# Patient Record
Sex: Male | Born: 1975 | Race: White | Hispanic: No | Marital: Single | Smoking: Current every day smoker
Health system: Southern US, Community
[De-identification: ages and names within clinical notes are randomized; demographics above are authoritative.]

## PROBLEM LIST (undated history)

## (undated) DIAGNOSIS — F1990 Other psychoactive substance use, unspecified, uncomplicated: Secondary | ICD-10-CM

## (undated) HISTORY — PX: SPLENECTOMY, TOTAL: SHX788

---

## 1998-04-16 ENCOUNTER — Emergency Department (HOSPITAL_COMMUNITY): Admission: EM | Admit: 1998-04-16 | Discharge: 1998-04-17 | Payer: Self-pay | Admitting: Emergency Medicine

## 1999-11-05 ENCOUNTER — Emergency Department (HOSPITAL_COMMUNITY): Admission: EM | Admit: 1999-11-05 | Discharge: 1999-11-05 | Payer: Self-pay | Admitting: Emergency Medicine

## 2004-09-05 ENCOUNTER — Emergency Department (HOSPITAL_COMMUNITY): Admission: EM | Admit: 2004-09-05 | Discharge: 2004-09-05 | Payer: Self-pay | Admitting: Emergency Medicine

## 2010-11-21 ENCOUNTER — Emergency Department (HOSPITAL_COMMUNITY)
Admission: EM | Admit: 2010-11-21 | Discharge: 2010-11-22 | Disposition: A | Discharge status: Home / Self Care | Payer: Self-pay | Attending: Emergency Medicine | Admitting: Emergency Medicine

## 2010-11-21 DIAGNOSIS — F1910 Other psychoactive substance abuse, uncomplicated: Secondary | ICD-10-CM | POA: Insufficient documentation

## 2010-11-21 DIAGNOSIS — F1410 Cocaine abuse, uncomplicated: Secondary | ICD-10-CM | POA: Insufficient documentation

## 2010-11-21 LAB — DIFFERENTIAL
Basophils Absolute: 0.1 10*3/uL (ref 0.0–0.1)
Basophils Relative: 2 % — ABNORMAL HIGH (ref 0–1)
Eosinophils Absolute: 0.4 10*3/uL (ref 0.0–0.7)
Monocytes Absolute: 0.4 10*3/uL (ref 0.1–1.0)
Monocytes Relative: 5 % (ref 3–12)

## 2010-11-21 LAB — BASIC METABOLIC PANEL
BUN: 14 mg/dL (ref 6–23)
Calcium: 9.3 mg/dL (ref 8.4–10.5)
Creatinine, Ser: 1.04 mg/dL (ref 0.4–1.5)
GFR calc Af Amer: >60 mL/min (ref >60)
GFR calc non Af Amer: >60 mL/min (ref >60)

## 2010-11-21 LAB — ETHANOL: Alcohol, Ethyl (B): <11 mg/dL — ABNORMAL HIGH (ref 0–10)

## 2010-11-21 LAB — CBC
MCH: 28.4 pg (ref 26.0–34.0)
MCHC: 33.7 g/dL (ref 30.0–36.0)
Platelets: 159 10*3/uL (ref 150–400)
RDW: 12.7 % (ref 11.5–15.5)

## 2010-11-22 LAB — URINALYSIS, ROUTINE W REFLEX MICROSCOPIC
Glucose, UA: NEGATIVE mg/dL
Ketones, ur: NEGATIVE mg/dL
Nitrite: NEGATIVE
Protein, ur: NEGATIVE mg/dL
Urobilinogen, UA: 0.2 mg/dL (ref 0.0–1.0)

## 2010-11-22 LAB — RAPID URINE DRUG SCREEN, HOSP PERFORMED: Benzodiazepines: NOT DETECTED

## 2012-08-24 ENCOUNTER — Telehealth (HOSPITAL_COMMUNITY): Payer: Self-pay | Admitting: Orthopedic Surgery
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
## 2012-09-04 ENCOUNTER — Ambulatory Visit (INDEPENDENT_AMBULATORY_CARE_PROVIDER_SITE_OTHER): Payer: Self-pay | Admitting: Orthopedic Surgery

## 2012-09-04 ENCOUNTER — Encounter (INDEPENDENT_AMBULATORY_CARE_PROVIDER_SITE_OTHER): Payer: Self-pay

## 2012-09-04 DIAGNOSIS — S35229A Unspecified injury of superior mesenteric artery, initial encounter: Secondary | ICD-10-CM

## 2012-09-04 DIAGNOSIS — S91009A Unspecified open wound, unspecified ankle, initial encounter: Secondary | ICD-10-CM

## 2012-09-04 DIAGNOSIS — S81009A Unspecified open wound, unspecified knee, initial encounter: Secondary | ICD-10-CM

## 2012-09-04 DIAGNOSIS — S81019A Laceration without foreign body, unspecified knee, initial encounter: Secondary | ICD-10-CM

## 2012-09-04 MED ORDER — IBUPROFEN 800 MG PO TABS: 800.0000 mg | ORAL_TABLET | Freq: Three times a day (TID) | ORAL | Status: DC

## 2012-09-04 MED ORDER — TRAMADOL HCL 50 MG PO TABS: 100.0000 mg | ORAL_TABLET | Freq: Four times a day (QID) | ORAL | Status: AC

## 2012-09-04 MED ORDER — HYDROCODONE-ACETAMINOPHEN 5-325 MG PO TABS: 1.0000 | ORAL_TABLET | ORAL | Status: DC | PRN

## 2012-09-04 NOTE — Patient Instructions (Signed)
No lifting more than 5 pounds until 6 weeks after your surgery date. After that, no restrictions.  No driving while taking hydrocodone.  You may return to work with the above restrictions.
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
## 2014-05-15 ENCOUNTER — Emergency Department (HOSPITAL_BASED_OUTPATIENT_CLINIC_OR_DEPARTMENT_OTHER)
Admission: EM | Admit: 2014-05-15 | Discharge: 2014-05-15 | Disposition: A | Discharge status: Home / Self Care | Payer: Self-pay | Attending: Emergency Medicine | Admitting: Emergency Medicine

## 2014-05-15 ENCOUNTER — Encounter (HOSPITAL_BASED_OUTPATIENT_CLINIC_OR_DEPARTMENT_OTHER): Payer: Self-pay | Admitting: Emergency Medicine

## 2014-05-15 DIAGNOSIS — L02512 Cutaneous abscess of left hand: Secondary | ICD-10-CM | POA: Insufficient documentation

## 2014-05-15 DIAGNOSIS — L03114 Cellulitis of left upper limb: Secondary | ICD-10-CM | POA: Insufficient documentation

## 2014-05-15 DIAGNOSIS — L0291 Cutaneous abscess, unspecified: Secondary | ICD-10-CM

## 2014-05-15 DIAGNOSIS — Z79891 Long term (current) use of opiate analgesic: Secondary | ICD-10-CM | POA: Insufficient documentation

## 2014-05-15 DIAGNOSIS — L0390 Cellulitis, unspecified: Secondary | ICD-10-CM

## 2014-05-15 DIAGNOSIS — Z720 Tobacco use: Secondary | ICD-10-CM | POA: Insufficient documentation

## 2014-05-15 MED ORDER — VANCOMYCIN HCL IN DEXTROSE 1-5 GM/200ML-% IV SOLN
1000.0000 mg | Freq: Once | INTRAVENOUS | Status: AC
  Administered 2014-05-15: 1000 mg via INTRAVENOUS
  Filled 2014-05-15: qty 200

## 2014-05-15 MED ORDER — SULFAMETHOXAZOLE-TRIMETHOPRIM 800-160 MG PO TABS: 1.0000 | ORAL_TABLET | Freq: Two times a day (BID) | ORAL | Status: AC

## 2014-05-15 MED ORDER — CEPHALEXIN 500 MG PO CAPS: 500.0000 mg | ORAL_CAPSULE | Freq: Four times a day (QID) | ORAL | Status: DC

## 2014-05-15 MED ORDER — LIDOCAINE HCL 2 % IJ SOLN
INTRAMUSCULAR | Status: AC
  Filled 2014-05-15: qty 20

## 2014-05-15 NOTE — ED Notes (Signed)
Patient here with left hand abscess, reports injected cocaine on Wednesday and Thursday am developed pain, redness and drainage from hand

## 2014-05-15 NOTE — ED Notes (Signed)
I & D tray is at the bedside set up and ready for the doctor to use. 

## 2014-05-15 NOTE — ED Notes (Signed)
MD at bedside. 

## 2014-05-15 NOTE — Discharge Instructions (Signed)

## 2014-05-15 NOTE — ED Provider Notes (Signed)
CSN: [id number]     Arrival date & time 05/15/14  1315 History   First MD Initiated Contact with Patient 05/15/14 1342     Chief Complaint  Patient presents with  . Abscess     (Consider location/radiation/quality/duration/timing/severity/associated sxs/prior Treatment) HPI Comments: Patient presents with redness and pain to his left hand. He is a substance abuser and was injecting heroin and cocaine into his hand about 3 days ago. He woke up this morning with redness and pain with drainage from the area. He states he was having a little bit of swelling and discomfort the last couple days. He only noticed the redness starting this morning. 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 He denies any fevers vomiting or other symptoms. His tetanus shot is up-to-date.  Patient is a 38 y.o. male presenting with abscess.  Abscess Associated symptoms: no fever, no headaches, no nausea and no vomiting     History reviewed. No pertinent past medical history. History reviewed. No pertinent past surgical history. No family history on file. History  Substance Use Topics  . Smoking status: Current Every Day Smoker    Types: Cigarettes  . Smokeless tobacco: Never Used  . Alcohol Use: Not on file    Review of Systems  Constitutional: Negative for fever.  Gastrointestinal: Negative for nausea and vomiting.  Musculoskeletal: Negative for arthralgias, back pain, joint swelling and neck pain.  Skin: Positive for color change and wound.  Neurological: Negative for weakness, numbness and headaches.      Allergies  Review of patient's allergies indicates no known allergies.  Home Medications   Prior to Admission medications   Medication Sig Start Date End Date Taking? Authorizing Provider  methadone (DOLOPHINE) 10 MG tablet Take 10 mg by mouth daily.   Yes Historical Provider, MD  cephALEXin (KEFLEX) 500 MG capsule Take 1 capsule (500 mg total) by mouth 4 (four) times daily.  05/15/14   Rolan BuccoMelanie Anel Purohit, MD  sulfamethoxazole-trimethoprim (BACTRIM DS,SEPTRA DS) 800-160 MG per tablet Take 1 tablet by mouth 2 (two) times daily. 05/15/14 05/22/14  Rolan BuccoMelanie Scharlene Catalina, MD   BP 135/52  Pulse 79  Temp(Src) 98.7 F (37.1 C) (Oral)  Resp 18  SpO2 94% Physical Exam  Constitutional: He is oriented to person, place, and time. He appears well-developed and well-nourished.  HENT:  Head: Normocephalic and atraumatic.  Eyes: Pupils are equal, round, and reactive to light.  Neck: Normal range of motion. Neck supple.  Cardiovascular: Normal rate.   Pulmonary/Chest: Effort normal.  Abdominal: Soft. There is no tenderness.  Musculoskeletal: He exhibits edema and tenderness.  Patient has swelling and redness over the dorsum of the left hand with a pointed abscess in the center of the redness. It's fluctuant in this area. There is no redness extending past the wrist. There is no pain with range of motion of the fingers. There is no pain to the volar surface of the hand.  Neurological: He is alert and oriented to person, place, and time.  Skin: Skin is warm and dry.  Psychiatric: He has a normal mood and affect.    ED Course  INCISION AND DRAINAGE Date/Time: 05/15/2014 3:11 PM Performed by: Neola Worrall Authorized by: Rolan BuccoBELFI, Konner Warrior Consent: Verbal consent obtained. Risks and benefits: risks, benefits and alternatives were discussed Consent given by: patient Patient identity confirmed: verbally with patient Type: abscess Body area: upper extremity Location details: left hand Anesthesia: local infiltration Local anesthetic: lidocaine 1% with epinephrine Anesthetic total: 2 ml Patient sedated: no  Scalpel size: 11 Incision type: elliptical Drainage: purulent Drainage amount: scant Wound treatment: wound left open Patient tolerance: Patient tolerated the procedure well with no immediate complications. Comments: I had already expressed a moderate amount of pus from the wound  prior to I&D   (including critical care time) Labs Review Labs Reviewed - No data to display  Imaging Review No results found.   EKG Interpretation None      MDM   Final diagnoses:  Abscess and cellulitis    Patient was given a dose of vancomycin. His tetanus shot is up-to-date. He has an abscess and surrounding cellulitis. At this point I don't suspect that deeper infection given that he has no pain with range of motion of the fingers or wrist. I do feel like he needs close follow-up and I advised him to come back tomorrow for recheck. If it's not looking significantly better or certainly if it's looking worse, a hand surgeon can be consulted.  Pt was started on keflex and bactim.      Rolan BuccoMelanie Krissi Willaims, MD 05/15/14 [phone]

## 2014-05-16 ENCOUNTER — Emergency Department (HOSPITAL_BASED_OUTPATIENT_CLINIC_OR_DEPARTMENT_OTHER): Admission: EM | Admit: 2014-05-16 | Discharge: 2014-05-16 | Discharge status: Against Medical Advice | Payer: Self-pay

## 2014-12-14 ENCOUNTER — Emergency Department (HOSPITAL_BASED_OUTPATIENT_CLINIC_OR_DEPARTMENT_OTHER)
Admission: EM | Admit: 2014-12-14 | Discharge: 2014-12-14 | Disposition: A | Discharge status: Home / Self Care | Payer: Self-pay | Attending: Emergency Medicine | Admitting: Emergency Medicine

## 2014-12-14 ENCOUNTER — Emergency Department (HOSPITAL_BASED_OUTPATIENT_CLINIC_OR_DEPARTMENT_OTHER): Payer: Self-pay

## 2014-12-14 ENCOUNTER — Encounter (HOSPITAL_BASED_OUTPATIENT_CLINIC_OR_DEPARTMENT_OTHER): Payer: Self-pay

## 2014-12-14 DIAGNOSIS — Z79899 Other long term (current) drug therapy: Secondary | ICD-10-CM | POA: Insufficient documentation

## 2014-12-14 DIAGNOSIS — L02414 Cutaneous abscess of left upper limb: Secondary | ICD-10-CM | POA: Insufficient documentation

## 2014-12-14 DIAGNOSIS — Z720 Tobacco use: Secondary | ICD-10-CM | POA: Insufficient documentation

## 2014-12-14 HISTORY — DX: Other psychoactive substance use, unspecified, uncomplicated: F19.90

## 2014-12-14 LAB — COMPREHENSIVE METABOLIC PANEL
ALBUMIN: 3.7 g/dL (ref 3.5–5.0)
ALT: 22 U/L (ref 17–63)
ANION GAP: 12 (ref 5–15)
AST: 27 U/L (ref 15–41)
Alkaline Phosphatase: 110 U/L (ref 38–126)
BUN: 15 mg/dL (ref 6–20)
CALCIUM: 8.4 mg/dL — AB (ref 8.9–10.3)
CHLORIDE: 102 mmol/L (ref 101–111)
CO2: 24 mmol/L (ref 22–32)
Creatinine, Ser: 1.15 mg/dL (ref 0.61–1.24)
GFR calc Af Amer: >60 mL/min (ref >60)
GFR calc non Af Amer: >60 mL/min (ref >60)
Glucose, Bld: 117 mg/dL — ABNORMAL HIGH (ref 65–99)
POTASSIUM: 4.1 mmol/L (ref 3.5–5.1)
SODIUM: 138 mmol/L (ref 135–145)
Total Bilirubin: 0.7 mg/dL (ref 0.3–1.2)
Total Protein: 7.5 g/dL (ref 6.5–8.1)

## 2014-12-14 LAB — CBC
HEMATOCRIT: 41.3 % (ref 39.0–52.0)
HEMOGLOBIN: 13.5 g/dL (ref 13.0–17.0)
MCH: 28.3 pg (ref 26.0–34.0)
MCHC: 32.7 g/dL (ref 30.0–36.0)
MCV: 86.6 fL (ref 78.0–100.0)
Platelets: 164 10*3/uL (ref 150–400)
RBC: 4.77 MIL/uL (ref 4.22–5.81)
RDW: 13.5 % (ref 11.5–15.5)
WBC: 11.4 10*3/uL — AB (ref 4.0–10.5)

## 2014-12-14 MED ORDER — HYDROMORPHONE HCL 1 MG/ML IJ SOLN
1.0000 mg | Freq: Once | INTRAMUSCULAR | Status: AC
  Administered 2014-12-14: 1 mg via INTRAVENOUS
  Filled 2014-12-14: qty 1

## 2014-12-14 MED ORDER — SULFAMETHOXAZOLE-TRIMETHOPRIM 800-160 MG PO TABS
1.0000 | ORAL_TABLET | Freq: Once | ORAL | Status: AC
  Administered 2014-12-14: 1 via ORAL
  Filled 2014-12-14: qty 1

## 2014-12-14 MED ORDER — CLINDAMYCIN HCL 300 MG PO CAPS: 300.0000 mg | ORAL_CAPSULE | Freq: Four times a day (QID) | ORAL | Status: DC

## 2014-12-14 MED ORDER — ACETAMINOPHEN 325 MG PO TABS
650.0000 mg | ORAL_TABLET | Freq: Once | ORAL | Status: AC
  Administered 2014-12-14: 650 mg via ORAL
  Filled 2014-12-14: qty 2

## 2014-12-14 MED ORDER — CLINDAMYCIN PHOSPHATE 600 MG/50ML IV SOLN
600.0000 mg | Freq: Once | INTRAVENOUS | Status: AC
  Administered 2014-12-14: 600 mg via INTRAVENOUS
  Filled 2014-12-14: qty 50

## 2014-12-14 MED ORDER — HYDROMORPHONE HCL 1 MG/ML IJ SOLN
2.0000 mg | Freq: Once | INTRAMUSCULAR | Status: AC
  Administered 2014-12-14: 2 mg via INTRAVENOUS
  Filled 2014-12-14: qty 2

## 2014-12-14 MED ORDER — OXYCODONE-ACETAMINOPHEN 5-325 MG PO TABS: 1.0000 | ORAL_TABLET | Freq: Three times a day (TID) | ORAL | Status: DC | PRN

## 2014-12-14 MED ORDER — SULFAMETHOXAZOLE-TRIMETHOPRIM 800-160 MG PO TABS: 1.0000 | ORAL_TABLET | Freq: Two times a day (BID) | ORAL | Status: AC

## 2014-12-14 MED ORDER — LIDOCAINE-EPINEPHRINE 2 %-1:100000 IJ SOLN
1.7000 mL | Freq: Once | INTRAMUSCULAR | Status: AC
  Administered 2014-12-14: 1.7 mL via INTRADERMAL
  Filled 2014-12-14: qty 1

## 2014-12-14 NOTE — ED Notes (Signed)
I/d cart at bedside per md request

## 2014-12-14 NOTE — Discharge Instructions (Signed)

## 2014-12-14 NOTE — ED Notes (Signed)
md is at bedside for I/D

## 2014-12-14 NOTE — ED Provider Notes (Signed)
Addendum: ABX given were clindamycin for anaerobic coverage and bactrim for gram negative coverage  Christopher Rhineonald Lucah Petta, MD 12/14/14 2338

## 2014-12-14 NOTE — ED Notes (Signed)
IV drug use 2 days ago-abscess to left forearm x 5 days

## 2014-12-14 NOTE — ED Provider Notes (Signed)
CSN: [id number]     Arrival date & time 12/14/14  1825 History  This chart was scribed for Zadie Rhine, MD by Richarda Overlie, ED Scribe. This patient was seen in room MH11/MH11 and the patient's care was started 7:09 PM.     Chief Complaint  Patient presents with  . Arm Swelling  Patient gave verbal permission to utilize photo for medical documentation only The image was not stored on any personal device  Patient is a 39 y.o. male presenting with drug problem. The history is provided by the patient. No language interpreter was used.  Drug Problem This is a recurrent problem. The current episode started more than 1 week ago. The problem has been gradually worsening. Pertinent negatives include no chest pain and no shortness of breath. The symptoms are aggravated by bending. Nothing relieves the symptoms.   HPI Comments: Christopher Shields is a 39 y.o. male who presents to the Emergency Department complaining of a left forearm abscess that he noticed 2 weeks ago. He reports some associated pain, weakness and numbness in his left forearm area. 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 He reports that he has had a fever of approximately 100 currently. Pt reports NKDA. He denies any hx of heart murmurs. 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 He denies vomiting, CP or SOB.   Past Medical History  Diagnosis Date  . Drug use    Past Surgical History  Procedure Laterality Date  . Splenectomy, total     No family history on file. History  Substance Use Topics  . Smoking status: Current Every Day Smoker    Types: Cigarettes  . Smokeless tobacco: Never Used  . Alcohol Use: Yes    Review of Systems  Constitutional: Positive for fever.  Respiratory: Negative for shortness of breath.   Cardiovascular: Negative for chest pain.  Skin: Positive for color change.       Abscess  All other systems reviewed and are negative.  Allergies  Review of patient's allergies  indicates no known allergies.  Home Medications   Prior to Admission medications   Medication Sig Start Date End Date Taking? Authorizing Provider  methadone (DOLOPHINE) 10 MG tablet Take 10 mg by mouth daily.    Historical Provider, MD   BP 134/78 mmHg  Pulse 103  Temp(Src) 100.2 F (37.9 C) (Oral)  Resp 18  Ht  (1.88 m)  Wt 230 lb (104.327 kg)  BMI 29.52 kg/m2  SpO2 94% Physical Exam  CONSTITUTIONAL: Well developed/well nourished HEAD: Normocephalic/atraumatic EYES: EOMI/PERRL ENMT: Mucous membranes moist NECK: supple no meningeal signs SPINE/BACK:entire spine nontender CV: S1/S2 noted, no murmurs/rubs/gallops noted LUNGS: Lungs are clear to auscultation bilaterally, no apparent distress ABDOMEN: soft, nontender, no rebound or guarding, bowel sounds noted throughout abdomen GU:no cva tenderness NEURO: Pt is awake/alert/appropriate, moves all extremitiesx4.  No facial droop.   EXTREMITIES: pulses normal/equal, full ROM. No crepitus, no streaking. See photo.  SKIN: warm, color normal PSYCH: no abnormalities of mood noted, alert and oriented to situation     ED Course  Procedures    INCISION AND DRAINAGE Performed by: Joya Gaskins Consent: Verbal consent obtained. Risks and benefits: risks, benefits and alternatives were discussed Type: abscess  Body area: left forearm  Anesthesia: local infiltration  Incision was made with a scalpel.  Local anesthetic: lidocaine 1% with epinephrine  Anesthetic total: 10 ml  Complexity: complex Blunt dissection to break up loculations  Drainage:  purulent  Drainage amount: significant drainage noted Wound was flushed extensively with normal saline  Patient tolerance: Patient tolerated the procedure well with no immediate complications. wound explored, no foreign bodies noted, no bone/tendon exposed, pt has full ROM of all fingers on left hand and edema is significantly improved and no crepitus  noted   DIAGNOSTIC STUDIES: Oxygen Saturation is 94% on RA, normal by my interpretation.    COORDINATION OF CARE: 7:16 PM Discussed treatment plan with pt at bedside and pt agreed to plan.  EMERGENCY DEPARTMENT US SOFT TISSUE INTERPRETATION "Study: Limited Ultrasound of the noted body part in comments below"  INDICATIONS: Soft tissue infection Multiple views of the body part are obtained with a multi-frequency linear probe  PERFORMED BY:  Myself  IMAGES ARCHIVED?: Yes  SIDE:Left  BODY PART:Upper extremity  FINDINGS: Abcess present  LIMITATIONS:  Emergent Procedure  INTERPRETATION:  Abcess present    10:16 PM Photo after I&D    Pt felt improved after I&D  No erythematous streaking He can range both elbow and wrist without difficulty He is not toxic appearing He had no crepitus I don't feel this represents necrotizing fascitis He will start oral clindamycin/bactrim at home and return in one day for wound check We discussed strict return precautions including worsened pain/redness to his arm Bandage applied to wound  Labs Review Labs Reviewed  CBC - Abnormal; Notable for the following:    WBC 11.4 (*)    All other components within normal limits  COMPREHENSIVE METABOLIC PANEL - Abnormal; Notable for the following:    Glucose, Bld 117 (*)    Calcium 8.4 (*)    All other components within normal limits  CULTURE, BLOOD (ROUTINE X 2)  CULTURE, BLOOD (ROUTINE X 2)    Imaging Review Dg Forearm Left  12/14/2014   CLINICAL DATA:  Forearm abscess  EXAM: LEFT FOREARM - 2 VIEW  COMPARISON:  None.  FINDINGS: Frontal and lateral views were obtained. There is extensive soft tissue edema throughout the proximal to mid forearm region, particularly along the volar aspect. No air or calcification is noted in this area of extensive soft tissue prominence. No fracture or dislocation. No erosive change or bony destruction. No appreciable arthropathy. There is a minus ulnar  variant.  IMPRESSION: No bony abnormality. Extensive soft tissue edema is noted, most severe more proximally in along the volar aspect. There may well be abscess in this area, although there is no air-fluid level. The appearance does suggest inflammatory etiology.   Electronically Signed   By: Bretta BangWilliam  Woodruff III M.D.   On: 12/14/2014 20:33      MDM   Final diagnoses:  Abscess of left forearm    Nursing notes including past medical history and social history reviewed and considered in documentation Labs/vital reviewed myself and considered during evaluation xrays/imaging reviewed by myself and considered during evaluation    I personally performed the services described in this documentation, which was scribed in my presence. The recorded information has been reviewed and is accurate.     Zadie Rhineonald Tekeisha Hakim, MD 12/14/14 [phone]

## 2014-12-14 NOTE — ED Notes (Signed)
Patient transported to X-ray 

## 2014-12-16 ENCOUNTER — Emergency Department (HOSPITAL_BASED_OUTPATIENT_CLINIC_OR_DEPARTMENT_OTHER)
Admission: EM | Admit: 2014-12-16 | Discharge: 2014-12-16 | Disposition: A | Discharge status: Home / Self Care | Payer: Self-pay | Attending: Emergency Medicine | Admitting: Emergency Medicine

## 2014-12-16 ENCOUNTER — Encounter (HOSPITAL_BASED_OUTPATIENT_CLINIC_OR_DEPARTMENT_OTHER): Payer: Self-pay | Admitting: *Deleted

## 2014-12-16 DIAGNOSIS — Z79899 Other long term (current) drug therapy: Secondary | ICD-10-CM | POA: Insufficient documentation

## 2014-12-16 DIAGNOSIS — Z4801 Encounter for change or removal of surgical wound dressing: Secondary | ICD-10-CM | POA: Insufficient documentation

## 2014-12-16 DIAGNOSIS — Z720 Tobacco use: Secondary | ICD-10-CM | POA: Insufficient documentation

## 2014-12-16 DIAGNOSIS — Z5189 Encounter for other specified aftercare: Secondary | ICD-10-CM

## 2014-12-16 DIAGNOSIS — Z792 Long term (current) use of antibiotics: Secondary | ICD-10-CM | POA: Insufficient documentation

## 2014-12-16 NOTE — ED Provider Notes (Signed)
CSN: [id number]     Arrival date & time 12/16/14  0840 History   First MD Initiated Contact with Patient 12/16/14 [phone]     Chief Complaint  Patient presents with  . Wound Check     (Consider location/radiation/quality/duration/timing/severity/associated sxs/prior Treatment) Patient is a 39 y.o. male presenting with wound check. The history is provided by the patient.  Wound Check This is a new problem. The current episode started more than 2 days ago. The problem occurs constantly. The problem has been gradually improving. Pertinent negatives include no chest pain, no abdominal pain, no headaches and no shortness of breath. Nothing aggravates the symptoms. Relieved by: abx, I/D. Treatments tried: I/D, abx. The treatment provided significant relief.    Past Medical History  Diagnosis Date  . Drug use    Past Surgical History  Procedure Laterality Date  . Splenectomy, total     History reviewed. No pertinent family history. History  Substance Use Topics  . Smoking status: Current Every Day Smoker    Types: Cigarettes  . Smokeless tobacco: Never Used  . Alcohol Use: Yes    Review of Systems  Constitutional: Negative for fever.  HENT: Negative for drooling and rhinorrhea.   Eyes: Negative for pain.  Respiratory: Negative for cough and shortness of breath.   Cardiovascular: Negative for chest pain and leg swelling.  Gastrointestinal: Negative for nausea, vomiting, abdominal pain and diarrhea.  Genitourinary: Negative for dysuria and hematuria.  Musculoskeletal: Negative for gait problem and neck pain.  Skin: Negative for color change.  Neurological: Negative for numbness and headaches.  Hematological: Negative for adenopathy.  Psychiatric/Behavioral: Negative for behavioral problems.  All other systems reviewed and are negative.     Allergies  Review of patient's allergies indicates no known allergies.  Home Medications   Prior to Admission medications   Medication  Sig Start Date End Date Taking? Authorizing Provider  clindamycin (CLEOCIN) 300 MG capsule Take 1 capsule (300 mg total) by mouth 4 (four) times daily. X 7 days 12/14/14   Zadie Rhine, MD  methadone (DOLOPHINE) 10 MG tablet Take 95 mg by mouth daily.     Historical Provider, MD  oxyCODONE-acetaminophen (PERCOCET/ROXICET) 5-325 MG per tablet Take 1 tablet by mouth every 8 (eight) hours as needed for severe pain. 12/14/14   Zadie Rhine, MD  sulfamethoxazole-trimethoprim (BACTRIM DS,SEPTRA DS) 800-160 MG per tablet Take 1 tablet by mouth 2 (two) times daily. 12/14/14 12/21/14  Zadie Rhine, MD   BP 126/92 mmHg  Pulse 65  Temp(Src) 98.5 F (36.9 C) (Oral)  Resp 18  Ht  (1.88 m)  Wt 240 lb (108.863 kg)  BMI 30.80 kg/m2  SpO2 95% Physical Exam  Constitutional: He is oriented to person, place, and time. He appears well-developed and well-nourished.  HENT:  Head: Normocephalic and atraumatic.  Right Ear: External ear normal.  Left Ear: External ear normal.  Nose: Nose normal.  Mouth/Throat: Oropharynx is clear and moist. No oropharyngeal exudate.  Eyes: Conjunctivae and EOM are normal. Pupils are equal, round, and reactive to light.  Neck: Normal range of motion. Neck supple.  Cardiovascular: Normal rate, regular rhythm, normal heart sounds and intact distal pulses.  Exam reveals no gallop and no friction rub.   No murmur heard. Pulmonary/Chest: Effort normal and breath sounds normal. No respiratory distress. He has no wheezes.  Abdominal: Soft. Bowel sounds are normal. He exhibits no distension. There is no tenderness. There is no rebound and no guarding.  Musculoskeletal: Normal range of  motion. He exhibits no edema or tenderness.  Postsurgical wound on mid volar aspect of left forearm. Resolving mild erythema surrounding the wound. Scant brownish drainage noted.  Normal strength and sensation in the left UE.  2+ distal pulses in the left upper extremity.    Neurological: He  is alert and oriented to person, place, and time.  Skin: Skin is warm and dry.  Psychiatric: He has a normal mood and affect. His behavior is normal.  Nursing note and vitals reviewed.   ED Course  Procedures (including critical care time) Labs Review Labs Reviewed - No data to display  Imaging Review Dg Forearm Left  12/14/2014   CLINICAL DATA:  Forearm abscess  EXAM: LEFT FOREARM - 2 VIEW  COMPARISON:  None.  FINDINGS: Frontal and lateral views were obtained. There is extensive soft tissue edema throughout the proximal to mid forearm region, particularly along the volar aspect. No air or calcification is noted in this area of extensive soft tissue prominence. No fracture or dislocation. No erosive change or bony destruction. No appreciable arthropathy. There is a minus ulnar variant.  IMPRESSION: No bony abnormality. Extensive soft tissue edema is noted, most severe more proximally in along the volar aspect. There may well be abscess in this area, although there is no air-fluid level. The appearance does suggest inflammatory etiology.   Electronically Signed   By: Bretta BangWilliam  Woodruff III M.D.   On: 12/14/2014 20:33     EKG Interpretation None        MDM   Final diagnoses:  Visit for wound check    9:13 AM 39 y.o. male who presents for wound evaluation status post incision and drainage 2 days ago. The patient notes he is feeling much better. He denies any fevers. He notes that the redness is resolving. There continues to be some mild drainage but otherwise he feels like he has improved significantly. I took a picture of the wound to compare to the previous visit. It does appear that the erythema is resolving. Will recommend he continue to take his anabiotics and return for any worsening.    Purvis SheffieldForrest Lyzette Reinhardt, MD 12/16/14 [phone]

## 2014-12-16 NOTE — ED Notes (Signed)
Return today for wound check last seen 2 days ago

## 2014-12-21 LAB — CULTURE, BLOOD (ROUTINE X 2)
CULTURE: NO GROWTH
CULTURE: NO GROWTH

## 2019-04-18 ENCOUNTER — Emergency Department (HOSPITAL_BASED_OUTPATIENT_CLINIC_OR_DEPARTMENT_OTHER)
Admission: EM | Admit: 2019-04-18 | Discharge: 2019-04-18 | Disposition: A | Discharge status: Home / Self Care | Payer: Self-pay | Attending: Emergency Medicine | Admitting: Emergency Medicine

## 2019-04-18 ENCOUNTER — Encounter (HOSPITAL_BASED_OUTPATIENT_CLINIC_OR_DEPARTMENT_OTHER): Payer: Self-pay

## 2019-04-18 ENCOUNTER — Other Ambulatory Visit: Payer: Self-pay

## 2019-04-18 DIAGNOSIS — F1910 Other psychoactive substance abuse, uncomplicated: Secondary | ICD-10-CM | POA: Insufficient documentation

## 2019-04-18 DIAGNOSIS — F17210 Nicotine dependence, cigarettes, uncomplicated: Secondary | ICD-10-CM | POA: Insufficient documentation

## 2019-04-18 DIAGNOSIS — H1033 Unspecified acute conjunctivitis, bilateral: Secondary | ICD-10-CM | POA: Insufficient documentation

## 2019-04-18 DIAGNOSIS — Z79899 Other long term (current) drug therapy: Secondary | ICD-10-CM | POA: Insufficient documentation

## 2019-04-18 MED ORDER — NEOMYCIN-POLYMYXIN-DEXAMETH 3.5-10000-0.1 OP SUSP: 2.0000 [drp] | Freq: Four times a day (QID) | OPHTHALMIC | 0 refills | Status: AC

## 2019-04-18 MED ORDER — NEOMYCIN-POLYMYXIN-DEXAMETH 3.5-10000-0.1 OP OINT
TOPICAL_OINTMENT | Freq: Once | OPHTHALMIC | Status: AC
  Administered 2019-04-18: 1 via OPHTHALMIC
  Filled 2019-04-18: qty 3.5

## 2019-04-18 NOTE — ED Triage Notes (Signed)
Pt has redness to bilateral eyes R>L. Pt reports eye drainage and a pressure behind the eyes.

## 2019-04-18 NOTE — ED Provider Notes (Signed)
[place] EMERGENCY DEPARTMENT Provider Note   CSN: [id number] Arrival date & time: 04/18/19  0037     History   Chief Complaint Chief Complaint  Patient presents with  . Eye Problem    HPI Christopher Shields is a 43 y.o. male.     The history is provided by the patient and a friend.  Eye Problem Location:  Both eyes Quality:  Tearing Severity:  Mild Onset quality:  Gradual Duration:  2 months Timing:  Constant Progression:  Worsening Context: contact lenses (that had been for at least 2 months)   Context: not burn   Relieved by:  Nothing Worsened by:  Nothing Ineffective treatments:  None tried Associated symptoms: discharge, inflammation and redness   Associated symptoms: no blurred vision, no decreased vision, no headaches, no itching, no photophobia, no scotomas and no swelling   Risk factors: no exposure to pinkeye, no previous injury to eye and no recent herpes zoster     Past Medical History:  Diagnosis Date  . Drug use     There are no active problems to display for this patient.   Past Surgical History:  Procedure Laterality Date  . SPLENECTOMY, TOTAL          Home Medications    Prior to Admission medications   Medication Sig Start Date End Date Taking? Authorizing Provider  methadone (DOLOPHINE) 10 MG tablet Take 95 mg by mouth daily.     [provider]  neomycin-polymyxin b-dexamethasone (MAXITROL) 3.5-10000-0.1 SUSP Place 2 drops into both eyes every 6 (six) hours for 7 days. 04/18/19 04/25/19  Colbert Curenton, Corene Cornea, MD    Family History No family history on file.  Social History Social History   Tobacco Use  . Smoking status: Current Every Day Smoker    Types: Cigarettes  . Smokeless tobacco: Never Used  Substance Use Topics  . Alcohol use: Yes  . Drug use: Yes    Types: IV    Comment: opiates      Allergies   Patient has no known allergies.   Review of Systems Review of Systems  Eyes: Positive for discharge  and redness. Negative for blurred vision, photophobia and itching.  Neurological: Negative for headaches.  All other systems reviewed and are negative.    Physical Exam Updated Vital Signs BP (!) 133/98 (BP Location: Left Arm)   Pulse 95   Temp 98.9 F (37.2 C) (Oral)   Resp 20   Ht 6\' 2"  (1.88 m)   Wt 88.5 kg   SpO2 100%   BMI 25.04 kg/m   Physical Exam Vitals signs and nursing note reviewed.  Constitutional:      Appearance: He is well-developed.  HENT:     Head: Normocephalic and atraumatic.  Eyes:     General: Lids are normal. No allergic shiner, visual field deficit or scleral icterus.       Right eye: Discharge (clear yellow) present. No foreign body.        Left eye: No foreign body or discharge.     Extraocular Movements: Extraocular movements intact.     Right eye: No nystagmus.     Left eye: No nystagmus.     Conjunctiva/sclera:     Right eye: Right conjunctiva is injected. No exudate or hemorrhage.    Left eye: Left conjunctiva is injected. No exudate or hemorrhage. Neck:     Musculoskeletal: Normal range of motion.  Cardiovascular:     Rate and Rhythm: Normal rate.  Pulmonary:     Effort: Pulmonary effort is normal. No respiratory distress.  Abdominal:     General: There is no distension.  Musculoskeletal: Normal range of motion.  Neurological:     Mental Status: He is alert.      ED Treatments / Results  Labs (all labs ordered are listed, but only abnormal results are displayed) Labs Reviewed - No data to display  EKG None  Radiology No results found.  Procedures Procedures (including critical care time)  Medications Ordered in ED Medications  neomycin-polymyxin b-dexamethasone (MAXITROL) ophthalmic ointment (1 application Both Eyes Given 04/18/19 0222)     Initial Impression / Assessment and Plan / ED Course  I have reviewed the triage vital signs and the nursing notes.  Pertinent labs & imaging results that were available during  my care of the patient were reviewed by me and considered in my medical decision making (see chart for details).   Likely conjunctival irritation 2/2 prolonged contact lens wearing but will cover for infection. No pain, vision loss or worsening blurry vision to suggest systemic cause, glaucoma or other emergent causes. Will follow up with optho if not improving in 3-5 days or starts to worsen.   Final Clinical Impressions(s) / ED Diagnoses   Final diagnoses:  Acute conjunctivitis of both eyes, unspecified acute conjunctivitis type    ED Discharge Orders         Ordered    neomycin-polymyxin b-dexamethasone (MAXITROL) 3.5-10000-0.1 SUSP  Every 6 hours     04/18/19 0208           Dartanion Teo, Barbara Cower, MD 04/18/19 [phone]

## 2019-04-18 NOTE — Discharge Instructions (Signed)
Use the ointment we gave you three times a day for 10 days. If you run out, use the prescription and follow directions.

## 2019-04-23 ENCOUNTER — Other Ambulatory Visit: Payer: Self-pay

## 2019-04-23 ENCOUNTER — Emergency Department (HOSPITAL_BASED_OUTPATIENT_CLINIC_OR_DEPARTMENT_OTHER): Payer: Self-pay

## 2019-04-23 ENCOUNTER — Inpatient Hospital Stay (HOSPITAL_BASED_OUTPATIENT_CLINIC_OR_DEPARTMENT_OTHER)
Admission: EM | Admit: 2019-04-23 | Discharge: 2019-04-26 | DRG: 871 | Discharge status: Against Medical Advice | Payer: Self-pay | Attending: Internal Medicine | Admitting: Internal Medicine

## 2019-04-23 ENCOUNTER — Encounter (HOSPITAL_BASED_OUTPATIENT_CLINIC_OR_DEPARTMENT_OTHER): Payer: Self-pay | Admitting: Emergency Medicine

## 2019-04-23 DIAGNOSIS — E876 Hypokalemia: Secondary | ICD-10-CM | POA: Diagnosis present

## 2019-04-23 DIAGNOSIS — D696 Thrombocytopenia, unspecified: Secondary | ICD-10-CM | POA: Diagnosis present

## 2019-04-23 DIAGNOSIS — F1910 Other psychoactive substance abuse, uncomplicated: Secondary | ICD-10-CM

## 2019-04-23 DIAGNOSIS — M4650 Other infective spondylopathies, site unspecified: Secondary | ICD-10-CM

## 2019-04-23 DIAGNOSIS — M009 Pyogenic arthritis, unspecified: Secondary | ICD-10-CM | POA: Diagnosis present

## 2019-04-23 DIAGNOSIS — Z20828 Contact with and (suspected) exposure to other viral communicable diseases: Secondary | ICD-10-CM | POA: Diagnosis present

## 2019-04-23 DIAGNOSIS — J181 Lobar pneumonia, unspecified organism: Secondary | ICD-10-CM

## 2019-04-23 DIAGNOSIS — Z9081 Acquired absence of spleen: Secondary | ICD-10-CM

## 2019-04-23 DIAGNOSIS — D649 Anemia, unspecified: Secondary | ICD-10-CM | POA: Diagnosis present

## 2019-04-23 DIAGNOSIS — F17210 Nicotine dependence, cigarettes, uncomplicated: Secondary | ICD-10-CM | POA: Diagnosis present

## 2019-04-23 DIAGNOSIS — R7881 Bacteremia: Secondary | ICD-10-CM | POA: Diagnosis present

## 2019-04-23 DIAGNOSIS — R6510 Systemic inflammatory response syndrome (SIRS) of non-infectious origin without acute organ dysfunction: Secondary | ICD-10-CM

## 2019-04-23 DIAGNOSIS — A4101 Sepsis due to Methicillin susceptible Staphylococcus aureus: Principal | ICD-10-CM | POA: Diagnosis present

## 2019-04-23 DIAGNOSIS — F1120 Opioid dependence, uncomplicated: Secondary | ICD-10-CM | POA: Diagnosis present

## 2019-04-23 DIAGNOSIS — G8929 Other chronic pain: Secondary | ICD-10-CM | POA: Diagnosis present

## 2019-04-23 DIAGNOSIS — F419 Anxiety disorder, unspecified: Secondary | ICD-10-CM | POA: Diagnosis present

## 2019-04-23 DIAGNOSIS — F1990 Other psychoactive substance use, unspecified, uncomplicated: Secondary | ICD-10-CM | POA: Diagnosis present

## 2019-04-23 DIAGNOSIS — M4652 Other infective spondylopathies, cervical region: Secondary | ICD-10-CM | POA: Diagnosis present

## 2019-04-23 DIAGNOSIS — F1513 Other stimulant abuse with withdrawal: Secondary | ICD-10-CM | POA: Diagnosis present

## 2019-04-23 DIAGNOSIS — R251 Tremor, unspecified: Secondary | ICD-10-CM | POA: Diagnosis present

## 2019-04-23 LAB — COMPREHENSIVE METABOLIC PANEL
ALT: 33 U/L (ref 0–44)
AST: 45 U/L — ABNORMAL HIGH (ref 15–41)
Albumin: 3.3 g/dL — ABNORMAL LOW (ref 3.5–5.0)
Alkaline Phosphatase: 113 U/L (ref 38–126)
Anion gap: 12 (ref 5–15)
BUN: 13 mg/dL (ref 6–20)
CO2: 24 mmol/L (ref 22–32)
Calcium: 8.8 mg/dL — ABNORMAL LOW (ref 8.9–10.3)
Chloride: 102 mmol/L (ref 98–111)
Creatinine, Ser: 0.74 mg/dL (ref 0.61–1.24)
GFR calc Af Amer: >60 mL/min (ref >60)
GFR calc non Af Amer: >60 mL/min (ref >60)
Glucose, Bld: 137 mg/dL — ABNORMAL HIGH (ref 70–99)
Potassium: 3.3 mmol/L — ABNORMAL LOW (ref 3.5–5.1)
Sodium: 138 mmol/L (ref 135–145)
Total Bilirubin: 0.6 mg/dL (ref 0.3–1.2)
Total Protein: 7.3 g/dL (ref 6.5–8.1)

## 2019-04-23 LAB — URINALYSIS, MICROSCOPIC (REFLEX)

## 2019-04-23 LAB — CBC WITH DIFFERENTIAL/PLATELET
Abs Immature Granulocytes: 0.11 10*3/uL — ABNORMAL HIGH (ref 0.00–0.07)
Basophils Absolute: 0 10*3/uL (ref 0.0–0.1)
Basophils Relative: 0 %
Eosinophils Absolute: 0 10*3/uL (ref 0.0–0.5)
Eosinophils Relative: 0 %
HCT: 41.2 % (ref 39.0–52.0)
Hemoglobin: 13.1 g/dL (ref 13.0–17.0)
Immature Granulocytes: 1 %
Lymphocytes Relative: 10 %
Lymphs Abs: 1 10*3/uL (ref 0.7–4.0)
MCH: 26.6 pg (ref 26.0–34.0)
MCHC: 31.8 g/dL (ref 30.0–36.0)
MCV: 83.6 fL (ref 80.0–100.0)
Monocytes Absolute: 0.8 10*3/uL (ref 0.1–1.0)
Monocytes Relative: 8 %
Neutro Abs: 8 10*3/uL — ABNORMAL HIGH (ref 1.7–7.7)
Neutrophils Relative %: 81 %
Platelets: 82 10*3/uL — ABNORMAL LOW (ref 150–400)
RBC: 4.93 MIL/uL (ref 4.22–5.81)
RDW: 13.6 % (ref 11.5–15.5)
Smear Review: NORMAL
WBC: 9.9 10*3/uL (ref 4.0–10.5)
nRBC: 0 % (ref 0.0–0.2)

## 2019-04-23 LAB — URINALYSIS, ROUTINE W REFLEX MICROSCOPIC
Bilirubin Urine: NEGATIVE
Glucose, UA: 100 mg/dL — AB
Ketones, ur: NEGATIVE mg/dL
Leukocytes,Ua: NEGATIVE
Nitrite: NEGATIVE
Protein, ur: 100 mg/dL — AB
Specific Gravity, Urine: 1.025 (ref 1.005–1.030)
pH: 6 (ref 5.0–8.0)

## 2019-04-23 LAB — BLOOD CULTURE ID PANEL (REFLEXED)

## 2019-04-23 LAB — SEDIMENTATION RATE: Sed Rate: 51 mm/hr — ABNORMAL HIGH (ref 0–16)

## 2019-04-23 LAB — PROTIME-INR
INR: 1.1 (ref 0.8–1.2)
Prothrombin Time: 13.9 seconds (ref 11.4–15.2)

## 2019-04-23 LAB — C-REACTIVE PROTEIN: CRP: 22 mg/dL — ABNORMAL HIGH (ref <1.0)

## 2019-04-23 LAB — SARS CORONAVIRUS 2 (TAT 6-24 HRS): SARS Coronavirus 2: NEGATIVE

## 2019-04-23 LAB — LACTIC ACID, PLASMA: Lactic Acid, Venous: 1.1 mmol/L (ref 0.5–1.9)

## 2019-04-23 MED ORDER — VANCOMYCIN HCL IN DEXTROSE 1-5 GM/200ML-% IV SOLN: 1000.0000 mg | Freq: Once | INTRAVENOUS | Status: DC

## 2019-04-23 MED ORDER — KETAMINE HCL 10 MG/ML IJ SOLN
0.3000 mg/kg | Freq: Once | INTRAMUSCULAR | Status: AC
  Administered 2019-04-23: 27 mg via INTRAVENOUS

## 2019-04-23 MED ORDER — ACETAMINOPHEN 325 MG PO TABS
650.0000 mg | ORAL_TABLET | Freq: Once | ORAL | Status: AC
  Administered 2019-04-23: 650 mg via ORAL

## 2019-04-23 MED ORDER — KETAMINE HCL 50 MG/ML IJ SOLN
INTRAMUSCULAR | Status: AC
  Filled 2019-04-23: qty 10

## 2019-04-23 MED ORDER — HYDROMORPHONE HCL 1 MG/ML IJ SOLN: 1.0000 mg | INTRAMUSCULAR | Status: DC | PRN

## 2019-04-23 MED ORDER — SODIUM CHLORIDE 0.9 % IV SOLN
2.0000 g | Freq: Two times a day (BID) | INTRAVENOUS | Status: DC
  Administered 2019-04-23 (×2): 2 g via INTRAVENOUS
  Filled 2019-04-23 (×2): qty 2

## 2019-04-23 MED ORDER — KETAMINE HCL 10 MG/ML IJ SOLN
INTRAMUSCULAR | Status: AC
  Filled 2019-04-23: qty 1

## 2019-04-23 MED ORDER — VANCOMYCIN HCL IN DEXTROSE 1-5 GM/200ML-% IV SOLN
1000.0000 mg | Freq: Once | INTRAVENOUS | Status: AC
  Administered 2019-04-23: 08:00:00 1000 mg via INTRAVENOUS
  Filled 2019-04-23: qty 200

## 2019-04-23 MED ORDER — SODIUM CHLORIDE 0.9 % IV SOLN
INTRAVENOUS | Status: DC | PRN
  Administered 2019-04-23: 250 mL via INTRAVENOUS

## 2019-04-23 MED ORDER — ONDANSETRON 4 MG PO TBDP
8.0000 mg | ORAL_TABLET | Freq: Once | ORAL | Status: DC
  Filled 2019-04-23: qty 1

## 2019-04-23 MED ORDER — FENTANYL CITRATE (PF) 100 MCG/2ML IJ SOLN
100.0000 ug | INTRAMUSCULAR | Status: DC
  Administered 2019-04-23 – 2019-04-24 (×4): 100 ug via INTRAVENOUS
  Filled 2019-04-23 (×4): qty 2

## 2019-04-23 MED ORDER — HYDROMORPHONE HCL 1 MG/ML IJ SOLN
2.0000 mg | Freq: Once | INTRAMUSCULAR | Status: AC
  Administered 2019-04-23: 2 mg via INTRAVENOUS
  Filled 2019-04-23: qty 2

## 2019-04-23 MED ORDER — SODIUM CHLORIDE 0.9 % IV BOLUS
30.0000 mL/kg | Freq: Once | INTRAVENOUS | Status: AC
  Administered 2019-04-23: 08:00:00 2655 mL via INTRAVENOUS

## 2019-04-23 MED ORDER — SODIUM CHLORIDE 0.9 % IV SOLN
0.5000 mg/kg/h | INTRAVENOUS | Status: DC
  Administered 2019-04-23: 0.5 mg/kg/h via INTRAVENOUS
  Filled 2019-04-23: qty 10

## 2019-04-23 MED ORDER — HYDROMORPHONE HCL 1 MG/ML IJ SOLN
1.0000 mg | Freq: Once | INTRAMUSCULAR | Status: AC
  Administered 2019-04-23: 1 mg via INTRAVENOUS
  Filled 2019-04-23: qty 1

## 2019-04-23 MED ORDER — PIPERACILLIN-TAZOBACTAM 3.375 G IVPB 30 MIN
3.3750 g | Freq: Once | INTRAVENOUS | Status: DC
  Filled 2019-04-23: qty 50

## 2019-04-23 MED ORDER — FENTANYL CITRATE (PF) 100 MCG/2ML IJ SOLN
100.0000 ug | Freq: Once | INTRAMUSCULAR | Status: AC
  Administered 2019-04-23: 100 ug via INTRAVENOUS
  Filled 2019-04-23: qty 2

## 2019-04-23 MED ORDER — HYDROMORPHONE HCL 1 MG/ML IJ SOLN: 1.0000 mg | INTRAMUSCULAR | Status: DC

## 2019-04-23 MED ORDER — HYDROMORPHONE HCL 1 MG/ML IJ SOLN
2.0000 mg | Freq: Once | INTRAMUSCULAR | Status: AC
  Administered 2019-04-23: 2 mg via INTRAMUSCULAR
  Filled 2019-04-23: qty 2

## 2019-04-23 MED ORDER — DOXYCYCLINE HYCLATE 100 MG PO TABS
200.0000 mg | ORAL_TABLET | Freq: Once | ORAL | Status: AC
  Administered 2019-04-23: 07:00:00 200 mg via ORAL
  Filled 2019-04-23: qty 2

## 2019-04-23 MED ORDER — ACETAMINOPHEN 325 MG PO TABS
ORAL_TABLET | ORAL | Status: AC
  Filled 2019-04-23: qty 2

## 2019-04-23 MED ORDER — FENTANYL CITRATE (PF) 100 MCG/2ML IJ SOLN
100.0000 ug | INTRAMUSCULAR | Status: DC | PRN
  Administered 2019-04-24: 100 ug via INTRAVENOUS
  Filled 2019-04-23: qty 2

## 2019-04-23 MED ORDER — LEVOFLOXACIN 750 MG PO TABS
750.0000 mg | ORAL_TABLET | Freq: Once | ORAL | Status: AC
  Administered 2019-04-23: 750 mg via ORAL
  Filled 2019-04-23: qty 1

## 2019-04-23 NOTE — ED Notes (Signed)
Requesting more pain med., Hank Pharm. D, aware

## 2019-04-23 NOTE — ED Provider Notes (Signed)
Clinical Course as of Apr 22 1757  Fri Apr 23, 2019  0647 Pt signed out to me.  Briefly 43 yo male presenting with fever and localized neck pain, right sided, CT scan concerning for septic facet arthritis.  Patient received oral levaquin and doxycycline.  Overnight team unable to establish IV access yet.  Pending IV placement, labs, and then admission.   [MT]  [phone] Able to place 20g left US-guided IV.  Will order vancomycin, cefepime, dilaudid.  On my exam the patient appears comfortable, has no photophobia, and complains of pain localized in left side of the neck.  I have a lower suspicion for meningitis at this time.   [MT]  0630 Lactic Acid, Venous: 1.1 [MT]  0811 WBC: 9.9 [MT]  0811 Platelets(!): 82 [MT]  0901 Signout given to Dr. Tamala Julian, hospitalist   [MT]  [phone] Patient is largely been unfazed by his Dilaudid doses so far.  I suspect he has a very high opioid tolerance.  We will give him another 2 mg of Dilaudid.   [MT]  [phone] Patient is still quite agitated with pain in his neck after receiving 2 mg of Dilaudid.  He demonstrates no somnolence or sign of opioid overdose, I suspect he has an extremely high tolerance to opioids.  We will try 100 mcg of fentanyl.    [MT]  1209 Patient is very frustrated that his pain is not being adequately treated.  I explained that I given him extremely large doses of Dilaudid and fentanyl.  We will try an analgesic dose of ketamine   [MT]  1406 Ketamine appears to have worked transiently.  However the patient is now recovering from his bolus is reporting even worse pain than initially.  After speaking to our in-house pharmacist, will initiate an IV ketamine infusion.  Unfortunately there has been quite a long delay in obtaining transportation to [facility], we still await [facility] transport.   [MT]    Clinical Course User Index [MT] Chevon Laufer, Carola Rhine, MD    CRITICAL CARE Performed by: Wyvonnia Dusky   Total critical care time: 45  minutes  Critical care time was exclusive of separately billable procedures and treating other patients.  Critical care was necessary to treat or prevent imminent or life-threatening deterioration.  Critical care was time spent personally by me on the following activities: development of treatment plan with patient and/or surrogate as well as nursing, discussions with consultants, evaluation of patient's response to treatment, examination of patient, obtaining history from patient or surrogate, ordering and performing treatments and interventions, ordering and review of laboratory studies, ordering and review of radiographic studies, pulse oximetry and re-evaluation of patient's condition.  This critical care time is in addition to time documented by the patient's first physician provider.  I assumed care of the patient and subsequently spent a total of 45 minutes ensuring IV access, giving additional IV antibiotics, reviewing lab results, and dosing continuous IV infusion of Ketamine for pain control.    Wyvonnia Dusky, MD 04/23/19 [phone]

## 2019-04-23 NOTE — ED Notes (Signed)
ED Provider at bedside. 

## 2019-04-23 NOTE — ED Notes (Addendum)
Multiple attempts made to obtain IV access without success. Ultrasound IV attempts made in R AC and L bicep by Richardson Landry RT and Cecille Aver RN without success. L shoulder IV and R knee IV by Cecille Aver without success. R foot IV by Danton Clap without success as patient asked to have it removed. Dr. Florina Ou made aware.

## 2019-04-23 NOTE — ED Triage Notes (Signed)
Patient presents with complaints of neck pain and headache onset yesterday; states fever 101 today; states took ibuprofen today. Ambulatory to room without assistance.

## 2019-04-23 NOTE — ED Provider Notes (Signed)
6:21 PM Patient is awaiting bed assignment for admission for his septic arthritis in the cervical spine and pneumonia.  Patient is on broad-spectrum antibiotics and was on a ketamine drip for pain control as the Dilaudid earlier overnight and in the morning was not providing backslash pain control.  I was informed by the nursing team that he cannot go to the initially assigned stepdown bed due to the ketamine drip.  They recommended I speak with the ICU team for higher level of care.  I spoke with Dr. Loanne Drilling with the pulmonary critical care team at [facility] and she also expressed concern about the ketamine infusion the patient was currently on for pain control.  We had a long discussion and agreed to try to transition the patient to more frequent Dilaudid dosing for pain management and stop the ketamine infusion or injections.  Due to the high frequency of Dilaudid use, patient will still need ICU level of care and a bed order was placed.  Anticipate admission for further management of the septic arthritis in his neck and the pneumonia.  6:30 PM Just spoke with patient about the need to change his pain regimen plan and patient agrees.  Patient reports that the ketamine is "make him feel weird".  Do not feel that there is an allergy or intolerance at this time but he would like to be switched to fentanyl instead of the Dilaudid.  Orders will be changed.  Anticipate admission to ICU for the frequent narcotic use.   Patient was transitioned to fentanyl and did not have further problems.  As patient is on fentanyl, he was felt to be appropriate for stepdown bed per [facility].  Patient will be admitted to stepdown for further management.  Dr. Hal Hope will admit patient.   Tegeler, Gwenyth Allegra, MD 04/24/19 [phone]

## 2019-04-23 NOTE — ED Notes (Addendum)
States, " None of the pain medicine has helped me " Rates pain 10/10, calm, watching TV

## 2019-04-23 NOTE — ED Notes (Signed)
EDP at bedside explained results and plan of care, risks associated with current imaging available. Pt willing to allow IV stick in leg again after pain administration.

## 2019-04-23 NOTE — ED Notes (Signed)
Lactic Acid cancelled per ED MD, lab notified , spoke with Arbie Cookey

## 2019-04-23 NOTE — Plan of Care (Signed)
Discussed with  Dr. Langston Masker Patient is a 43 year old male with a past medical history significant for IV drug abuse; who presents with pain in his neck.  Fever up to 101 F on arrival.  CT imaging revealing C3-4 erosive right facet arthropathy with anterior anterolisthesis concerning for septic arthritis.  Patient was initially given doxycycline and then antibiotics of vancomycin and cefepime.  Blood cultures were obtained.  Patient reportedly refused COVID-19 screening.  Admitted to a telemetry bed as inpatient.  Ordered ESR and CRP.  Will need MRI of the neck to further evaluate and consults to neurosurgery.

## 2019-04-23 NOTE — ED Provider Notes (Signed)
[facility] Provider Note: Christopher Dell, MD, FACEP  CSN: [id number] MRN: [id number] ARRIVAL: 04/23/19 at 0243 ROOM: [facility]07/[facility]07   CHIEF COMPLAINT  Neck Pain   HISTORY OF PRESENT ILLNESS  04/23/19 3:34 AM Christopher Shields is a 43 y.o. male IV narcotic user.  He is here with about a day and a half (30 hours) of pain in the right side of the neck.  The pain is localized primarily to the right sternocleidomastoid muscle.  He rates the pain as a 10 out of 10, worse with palpation or attempted to move the neck.  Range of motion of the neck is limited due to the pain.  He has an associated headache but states this is not severe.  He has had an associated fever and noted to be 101 on arrival and he was given acetaminophen for this.  He had taken ibuprofen earlier.  He denies shortness of breath or focal neurologic deficits.   Past Medical History:  Diagnosis Date  . Drug use     Past Surgical History:  Procedure Laterality Date  . SPLENECTOMY, TOTAL      History reviewed. No pertinent family history.  Social History   Tobacco Use  . Smoking status: Current Every Day Smoker    Types: Cigarettes  . Smokeless tobacco: Never Used  Substance Use Topics  . Alcohol use: Yes  . Drug use: Yes    Types: IV    Comment: opiates     Prior to Admission medications   Medication Sig Start Date End Date Taking? Authorizing Provider  neomycin-polymyxin b-dexamethasone (MAXITROL) 3.5-10000-0.1 SUSP Place 2 drops into both eyes every 6 (six) hours for 7 days. 04/18/19 04/25/19  Mesner, Barbara Cower, MD    Allergies Patient has no known allergies.   REVIEW OF SYSTEMS  Negative except as noted here or in the History of Present Illness.   PHYSICAL EXAMINATION  Initial Vital Signs Blood pressure 117/76, pulse (!) 140, temperature (!) 101 F (38.3 C), temperature source Oral, resp. rate 18, height 6\' 1"  (1.854 m), weight 88.5 kg, SpO2 97 %.  Examination General: Well-developed,  well-nourished male in no acute distress; appearance consistent with age of record HENT: normocephalic; atraumatic Eyes: pupils equal, round and reactive to light; extraocular muscles intact; right subconjunctival hemorrhage Neck: Tenderness of right sided soft tissue, primarily the sternocleidomastoid muscle; decreased range of motion due to pain; negative Brudzinski sign; negative Kernig sign Heart: regular rate and rhythm; cardia Lungs: clear to auscultation bilaterally Abdomen: soft; nondistended; nontender; no masses or hepatosplenomegaly; bowel sounds present Extremities: No deformity; full range of motion; pulses normal Neurologic: Awake, alert and oriented; motor function intact in all extremities and symmetric; no facial droop Skin: Warm and dry; stigmata of IV drug abuse Psychiatric: Normal mood and affect   RESULTS  Summary of this visit's results, reviewed by myself:   EKG Interpretation  Date/Time:  Friday April 23 2019 03:13:12 EDT Ventricular Rate:  134 PR Interval:    QRS Duration: 88 QT Interval:  307 QTC Calculation: 459 R Axis:   69 Text Interpretation:  Sinus tachycardia Borderline T wave abnormalities No previous ECGs available Confirmed by [id number] (Paula Libra) on 04/23/2019 3:23:34 AM      Laboratory Studies: Results for orders placed or performed during the hospital encounter of 04/23/19 (from the past 24 hour(s))  Culture, blood (Routine x 2)     Status: None (Preliminary result)   Collection Time: 04/23/19  3:34 AM   Specimen: BLOOD  RIGHT ARM  Result Value Ref Range   Specimen Description      BLOOD RIGHT ARM Performed at [facility], [address] [place], [place] [address]    Special Requests      BOTTLES DRAWN AEROBIC AND ANAEROBIC Blood Culture adequate volume Performed at [facility], [address] [place], [place] [address]    Culture  Setup Time      IN BOTH AEROBIC AND ANAEROBIC BOTTLES GRAM POSITIVE COCCI  Performed at [facility], [place] [address] [place], [place] [address]    Culture PENDING    Report Status PENDING   Culture, blood (Routine x 2)     Status: None (Preliminary result)   Collection Time: 04/23/19  4:45 AM   Specimen: BLOOD  Result Value Ref Range   Specimen Description      BLOOD RIGHT KNEE Performed at [facility], [address] [place], [place] [address]    Special Requests      BOTTLES DRAWN AEROBIC AND ANAEROBIC Blood Culture adequate volume Performed at [facility], [address] [place], [place] [address]    Culture  Setup Time      IN BOTH AEROBIC AND ANAEROBIC BOTTLES GRAM POSITIVE COCCI Organism ID to follow Performed at [facility], [place] [address] [place], [place] [address]    Culture PENDING    Report Status PENDING   Lactic acid, plasma     Status: None   Collection Time: 04/23/19  5:21 AM  Result Value Ref Range   Lactic Acid, Venous 1.1 0.5 - 1.9 mmol/L  CBC with Differential     Status: Abnormal   Collection Time: 04/23/19  7:30 AM  Result Value Ref Range   WBC 9.9 4.0 - 10.5 K/uL   RBC 4.93 4.22 - 5.81 MIL/uL   Hemoglobin 13.1 13.0 - 17.0 g/dL   HCT 41.2 39.0 - 52.0 %   MCV 83.6 80.0 - 100.0 fL   MCH 26.6 26.0 - 34.0 pg   MCHC 31.8 30.0 - 36.0 g/dL   RDW 13.6 11.5 - 15.5 %   Platelets 82 (L) 150 - 400 K/uL   nRBC 0.0 0.0 - 0.2 %   Neutrophils Relative % 81 %   Neutro Abs 8.0 (H) 1.7 - 7.7 K/uL   Lymphocytes Relative 10 %   Lymphs Abs 1.0 0.7 - 4.0 K/uL   Monocytes Relative 8 %   Monocytes Absolute 0.8 0.1 - 1.0 K/uL   Eosinophils Relative 0 %   Eosinophils Absolute 0.0 0.0 - 0.5 K/uL   Basophils Relative 0 %   Basophils Absolute 0.0 0.0 - 0.1 K/uL   WBC Morphology DOHLE BODIES    RBC Morphology MORPHOLOGY UNREMARKABLE    Smear Review Normal platelet morphology    Immature Granulocytes 1 %   Abs Immature Granulocytes 0.11 (H) 0.00 - 0.07 K/uL  Comprehensive metabolic panel     Status:  Abnormal   Collection Time: 04/23/19  7:30 AM  Result Value Ref Range   Sodium 138 135 - 145 mmol/L   Potassium 3.3 (L) 3.5 - 5.1 mmol/L   Chloride 102 98 - 111 mmol/L   CO2 24 22 - 32 mmol/L   Glucose, Bld 137 (H) 70 - 99 mg/dL   BUN 13 6 - 20 mg/dL   Creatinine, Ser 0.74 0.61 - 1.24 mg/dL   Calcium 8.8 (L) 8.9 - 10.3 mg/dL  Total Protein 7.3 6.5 - 8.1 g/dL   Albumin 3.3 (L) 3.5 - 5.0 g/dL   AST 45 (H) 15 - 41 U/L   ALT 33 0 - 44 U/L   Alkaline Phosphatase 113 38 - 126 U/L   Total Bilirubin 0.6 0.3 - 1.2 mg/dL   GFR calc non Af Amer >60 >60 mL/min   GFR calc Af Amer >60 >60 mL/min   Anion gap 12 5 - 15  Protime-INR     Status: None   Collection Time: 04/23/19  7:30 AM  Result Value Ref Range   Prothrombin Time 13.9 11.4 - 15.2 seconds   INR 1.1 0.8 - 1.2  Sedimentation rate     Status: Abnormal   Collection Time: 04/23/19  7:30 AM  Result Value Ref Range   Sed Rate 51 (H) 0 - 16 mm/hr  C-reactive protein     Status: Abnormal   Collection Time: 04/23/19  9:10 AM  Result Value Ref Range   CRP 22.0 (H) <1.0 mg/dL  SARS CORONAVIRUS 2 (TAT 6-24 HRS) Nasopharyngeal Nasopharyngeal Swab     Status: None   Collection Time: 04/23/19  9:52 AM   Specimen: Nasopharyngeal Swab  Result Value Ref Range   SARS Coronavirus 2 NEGATIVE NEGATIVE  Urinalysis, Routine w reflex microscopic     Status: Abnormal   Collection Time: 04/23/19  3:10 PM  Result Value Ref Range   Color, Urine YELLOW YELLOW   APPearance CLEAR CLEAR   Specific Gravity, Urine 1.025 1.005 - 1.030   pH 6.0 5.0 - 8.0   Glucose, UA 100 (A) NEGATIVE mg/dL   Hgb urine dipstick TRACE (A) NEGATIVE   Bilirubin Urine NEGATIVE NEGATIVE   Ketones, ur NEGATIVE NEGATIVE mg/dL   Protein, ur 161 (A) NEGATIVE mg/dL   Nitrite NEGATIVE NEGATIVE   Leukocytes,Ua NEGATIVE NEGATIVE  Urinalysis, Microscopic (reflex)     Status: Abnormal   Collection Time: 04/23/19  3:10 PM  Result Value Ref Range   RBC / HPF 0-5 0 - 5 RBC/hpf   WBC,  UA 0-5 0 - 5 WBC/hpf   Bacteria, UA MANY (A) NONE SEEN   Squamous Epithelial / LPF 0-5 0 - 5   Granular Casts, UA PRESENT    Amorphous Crystal PRESENT    Imaging Studies: Dg Chest 2 View  Result Date: 04/23/2019 CLINICAL DATA:  Sepsis EXAM: CHEST - 2 VIEW COMPARISON:  None. FINDINGS: Consolidation versus mass at the right apex. Indistinct opacity at the lung bases. No edema, effusion, or pneumothorax. Normal heart size and mediastinal contours. IMPRESSION: Pneumonia versus nodule at the right apex. Streaky opacity at the bases from atelectasis or bronchopneumonia. Followup PA and lateral chest X-ray is recommended in 3-4 weeks following trial of antibiotic therapy to ensure resolution and exclude mass. Electronically Signed   By: Marnee Spring M.D.   On: 04/23/2019 05:32   Ct Soft Tissue Neck Wo Contrast  Result Date: 04/23/2019 CLINICAL DATA:  Right-sided neck pain and fever. Bilateral eye redness. Suspected sepsis. IV drug abuse with no IV access EXAM: CT NECK WITHOUT CONTRAST TECHNIQUE: Multidetector CT imaging of the neck was performed following the standard protocol without intravenous contrast. COMPARISON:  None. FINDINGS: Pharynx and larynx: Normal. No mass or swelling. Salivary glands: No inflammation, mass, or stone. Thyroid: Normal. Lymph nodes: None enlarged or abnormal density. Vascular: Negative Limited intracranial: Negative. Visualized orbits: Normal Mastoids and visualized paranasal sinuses: Clear Skeleton: Right C3-4 facet arthropathy with subchondral erosion and anterolisthesis. Subtle but convincing  regional soft tissue edema, which is remarkable for a noncontrast CT. No gross collection. Upper chest: Consolidation in the right upper lobe without cavitation. IMPRESSION: 1. C3-4 erosive right facet arthropathy with anterolisthesis. Findings primarily worrisome for septic arthritis given history of IV drug abuse and degree of regional soft tissue edema. MRI with contrast would be the  best imaging study for full characterization. 2. Right upper lobe pneumonia without cavitation. Electronically Signed   By: Marnee SpringJonathon  Watts M.D.   On: 04/23/2019 05:47    ED COURSE and MDM  Nursing notes and initial vitals signs, including pulse oximetry, reviewed.  Vitals:   04/23/19 2000 04/23/19 2030 04/23/19 2100 04/23/19 2200  BP: 113/70 114/74 112/79 114/69  Pulse: (!) 116 (!) 114 (!) 108 (!) 112  Resp: (!) 22 19 19  (!) 23  Temp:      TempSrc:      SpO2: 95% 95% 95% 94%  Weight:      Height:       3:59 AM Sepsis protocol initiated.  Zosyn and vancomycin ordered for likely soft tissue infection in the setting of asplenia and IV drug abuse.  His presentation is not consistent with meningitis given the localized tenderness and stiffness of the neck, normal mental status, and lack of meningeal signs.  CT of the neck with contrast pending.  5:09 AM Attempts at upper extremity IVs were successful. Despite adequate veins in his lower extremities the patient is refusing to allow nursing staff to start an IV.  Blood cultures and other laboratory studies have been obtained.  Pending acquisition of IV access we will start oral antibiotic therapy with levofloxacin and doxycycline as a stopgap measure.  6:56 AM Nursing staff unable to obtain IV access in the patient's lower extremities.  Dr. Renaye Rakersrifan will attempt IV or central line by US.  PROCEDURES   CRITICAL CARE Performed by: Carlisle BeersJohn L Bradyn Vassey Total critical care time: 45 minutes Critical care time was exclusive of separately billable procedures and treating other patients. Critical care was necessary to treat or prevent imminent or life-threatening deterioration. Critical care was time spent personally by me on the following activities: development of treatment plan with patient and/or surrogate as well as nursing, discussions with consultants, evaluation of patient's response to treatment, examination of patient, obtaining history from  patient or surrogate, ordering and performing treatments and interventions, ordering and review of laboratory studies, ordering and review of radiographic studies, pulse oximetry and re-evaluation of patient's condition.   ED DIAGNOSES     ICD-10-CM   1. Septic arthritis of vertebra, due to unspecified organism (HCC)  M46.50   2. IV drug abuse (HCC)  F19.10   3. SIRS (systemic inflammatory response syndrome) (HCC)  R65.10        Nixon Sparr, MD 04/23/19 2244

## 2019-04-23 NOTE — ED Notes (Signed)
Family at bedside. 

## 2019-04-23 NOTE — ED Notes (Signed)
Another IV attempted in L leg by this RN without success.

## 2019-04-23 NOTE — ED Notes (Signed)
Requests to talk to ED MD, regarding pain medicine. ED MD informed

## 2019-04-23 NOTE — ED Notes (Signed)
Refuses to have COVID swab, states," I will let you know when I am ready"

## 2019-04-23 NOTE — ED Notes (Signed)
Requested to talk to ED MD, states is not going to wait much longer for a bed and wants to go home. ED MD informed and will talk with him

## 2019-04-24 ENCOUNTER — Inpatient Hospital Stay (HOSPITAL_COMMUNITY): Payer: Self-pay

## 2019-04-24 DIAGNOSIS — R7881 Bacteremia: Secondary | ICD-10-CM

## 2019-04-24 DIAGNOSIS — J189 Pneumonia, unspecified organism: Secondary | ICD-10-CM

## 2019-04-24 DIAGNOSIS — E876 Hypokalemia: Secondary | ICD-10-CM

## 2019-04-24 DIAGNOSIS — F1910 Other psychoactive substance abuse, uncomplicated: Secondary | ICD-10-CM

## 2019-04-24 DIAGNOSIS — F1990 Other psychoactive substance use, unspecified, uncomplicated: Secondary | ICD-10-CM | POA: Diagnosis present

## 2019-04-24 DIAGNOSIS — J181 Lobar pneumonia, unspecified organism: Secondary | ICD-10-CM

## 2019-04-24 DIAGNOSIS — M4652 Other infective spondylopathies, cervical region: Secondary | ICD-10-CM

## 2019-04-24 DIAGNOSIS — D649 Anemia, unspecified: Secondary | ICD-10-CM | POA: Diagnosis present

## 2019-04-24 DIAGNOSIS — J15211 Pneumonia due to Methicillin susceptible Staphylococcus aureus: Secondary | ICD-10-CM

## 2019-04-24 DIAGNOSIS — F17210 Nicotine dependence, cigarettes, uncomplicated: Secondary | ICD-10-CM

## 2019-04-24 DIAGNOSIS — F1110 Opioid abuse, uncomplicated: Secondary | ICD-10-CM

## 2019-04-24 DIAGNOSIS — M4650 Other infective spondylopathies, site unspecified: Secondary | ICD-10-CM

## 2019-04-24 DIAGNOSIS — D696 Thrombocytopenia, unspecified: Secondary | ICD-10-CM | POA: Diagnosis present

## 2019-04-24 DIAGNOSIS — Z9081 Acquired absence of spleen: Secondary | ICD-10-CM

## 2019-04-24 DIAGNOSIS — B9561 Methicillin susceptible Staphylococcus aureus infection as the cause of diseases classified elsewhere: Secondary | ICD-10-CM

## 2019-04-24 LAB — CBC
HCT: 39 % (ref 39.0–52.0)
Hemoglobin: 12.7 g/dL — ABNORMAL LOW (ref 13.0–17.0)
MCH: 26.4 pg (ref 26.0–34.0)
MCHC: 32.6 g/dL (ref 30.0–36.0)
MCV: 81.1 fL (ref 80.0–100.0)
Platelets: 123 10*3/uL — ABNORMAL LOW (ref 150–400)
RBC: 4.81 MIL/uL (ref 4.22–5.81)
RDW: 13.8 % (ref 11.5–15.5)
WBC: 11.4 10*3/uL — ABNORMAL HIGH (ref 4.0–10.5)
nRBC: 0 % (ref 0.0–0.2)

## 2019-04-24 LAB — ECHOCARDIOGRAM COMPLETE
Height: 73 in
Weight: 3121.71 oz

## 2019-04-24 LAB — CREATININE, SERUM
Creatinine, Ser: 0.66 mg/dL (ref 0.61–1.24)
GFR calc Af Amer: >60 mL/min (ref >60)
GFR calc non Af Amer: >60 mL/min (ref >60)

## 2019-04-24 MED ORDER — DIPHENHYDRAMINE HCL 12.5 MG/5ML PO ELIX: 12.5000 mg | ORAL_SOLUTION | Freq: Four times a day (QID) | ORAL | Status: DC | PRN

## 2019-04-24 MED ORDER — SODIUM CHLORIDE 0.9% FLUSH: 9.0000 mL | INTRAVENOUS | Status: DC | PRN

## 2019-04-24 MED ORDER — DIPHENHYDRAMINE HCL 50 MG/ML IJ SOLN: 12.5000 mg | Freq: Four times a day (QID) | INTRAMUSCULAR | Status: DC | PRN

## 2019-04-24 MED ORDER — ONDANSETRON HCL 4 MG/2ML IJ SOLN: 4.0000 mg | Freq: Four times a day (QID) | INTRAMUSCULAR | Status: DC | PRN

## 2019-04-24 MED ORDER — NALOXONE HCL 0.4 MG/ML IJ SOLN: 0.4000 mg | INTRAMUSCULAR | Status: DC | PRN

## 2019-04-24 MED ORDER — ENOXAPARIN SODIUM 40 MG/0.4ML ~~LOC~~ SOLN
40.0000 mg | SUBCUTANEOUS | Status: DC
  Administered 2019-04-24 – 2019-04-26 (×3): 40 mg via SUBCUTANEOUS
  Filled 2019-04-24 (×3): qty 0.4

## 2019-04-24 MED ORDER — ACETAMINOPHEN 325 MG PO TABS
ORAL_TABLET | ORAL | Status: AC
  Filled 2019-04-24: qty 2

## 2019-04-24 MED ORDER — ACETAMINOPHEN 325 MG PO TABS
650.0000 mg | ORAL_TABLET | Freq: Once | ORAL | Status: AC
  Administered 2019-04-24: 02:00:00 650 mg via ORAL

## 2019-04-24 MED ORDER — POTASSIUM CHLORIDE CRYS ER 20 MEQ PO TBCR
40.0000 meq | EXTENDED_RELEASE_TABLET | Freq: Once | ORAL | Status: AC
  Administered 2019-04-24: 12:00:00 40 meq via ORAL
  Filled 2019-04-24: qty 2

## 2019-04-24 MED ORDER — HYDROMORPHONE 1 MG/ML IV SOLN
INTRAVENOUS | Status: DC
  Administered 2019-04-24: 30 mg via INTRAVENOUS
  Administered 2019-04-24: via INTRAVENOUS
  Administered 2019-04-25: 30 mg via INTRAVENOUS
  Filled 2019-04-24 (×2): qty 30

## 2019-04-24 MED ORDER — LORAZEPAM 1 MG PO TABS
1.0000 mg | ORAL_TABLET | ORAL | Status: DC | PRN
  Administered 2019-04-24: 12:00:00 1 mg via ORAL
  Filled 2019-04-24: qty 1

## 2019-04-24 MED ORDER — FENTANYL 40 MCG/ML IV SOLN
INTRAVENOUS | Status: DC
  Administered 2019-04-24: 05:00:00 1000 ug via INTRAVENOUS
  Filled 2019-04-24 (×5): qty 25

## 2019-04-24 MED ORDER — LORAZEPAM 1 MG PO TABS
2.0000 mg | ORAL_TABLET | ORAL | Status: DC | PRN
  Administered 2019-04-25 – 2019-04-26 (×4): 2 mg via ORAL
  Filled 2019-04-24 (×4): qty 2

## 2019-04-24 MED ORDER — CEFAZOLIN SODIUM-DEXTROSE 2-4 GM/100ML-% IV SOLN
2.0000 g | Freq: Three times a day (TID) | INTRAVENOUS | Status: DC
  Administered 2019-04-24 – 2019-04-26 (×8): 2 g via INTRAVENOUS
  Filled 2019-04-24 (×9): qty 100

## 2019-04-24 NOTE — Progress Notes (Signed)
Pt has reached the max on his dilaudid pca 2 hours in a row.  Pt is anxious and has tremors and sweats, elevated CIWA.  Reported to Arrien

## 2019-04-24 NOTE — Plan of Care (Signed)
  Problem: Clinical Measurements: Goal: Ability to maintain clinical measurements within normal limits will improve Outcome: Progressing   Problem: Pain Managment: Goal: General experience of comfort will improve Outcome: Progressing   

## 2019-04-24 NOTE — Progress Notes (Signed)
Pt took the maximum dose of dilaudid for the last hour, pain is still 7, pt slightly aggitated.

## 2019-04-24 NOTE — ED Notes (Addendum)
Received call from microbiology with positive results gram positive cocci staph aureus 4/4 blood cultures; Dr Sherry Ruffing aware
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
## 2019-04-24 NOTE — H&P (Signed)
History and Physical    Christopher Shields NFA:[id number] DOB: 09-07-1975 DOA: 04/23/2019  PCP: Patient, No Pcp Per  Patient coming from: Home  I have personally briefly reviewed patient's [facility]  Chief Complaint: Neck pain  HPI: Christopher Shields is a 43 y.o. male with medical history significant of IVDU.  Patient presented to ED yesterday morning with 1.5 days of R sided neck pain.  10/10, worse with palpation or attempt to move his neck.  Associated fever up to 101.  No SOB, no new extremity weakness nor numbness.   ED Course: Found to have septic arthrits of neck on CT as well as RUL PNA.  Started on empiric cefepime, doxy, levaquin, vanc in ED.  BCx have come back with MSSA in 4/4 bottles just prior to [facility] to [facility].   Review of Systems: As per HPI, otherwise all review of systems negative.  Past Medical History:  Diagnosis Date  . Drug use     Past Surgical History:  Procedure Laterality Date  . SPLENECTOMY, TOTAL       reports that he has been smoking cigarettes. He has never used smokeless tobacco. He reports current alcohol use. He reports current drug use. Drug: IV.  No Known Allergies  History reviewed. No pertinent family history.   Prior to Admission medications   Medication Sig Start Date End Date Taking? Authorizing Provider  neomycin-polymyxin b-dexamethasone (MAXITROL) 3.5-10000-0.1 SUSP Place 2 drops into both eyes every 6 (six) hours for 7 days. 04/18/19 04/25/19  Mesner, Barbara CowerJason, MD    Physical Exam: Vitals:   04/24/19 0055 04/24/19 0100 04/24/19 0130 04/24/19 0255  BP: 126/76 124/75 125/79 126/79  Pulse: (!) 115 (!) 108 (!) 117 (!) 103  Resp: 20 (!) 26 (!) 22 20  Temp:    99.1 F (37.3 C)  TempSrc:    Oral  SpO2: 94% 94% 96% 96%  Weight:      Height:        Constitutional: NAD, calm, comfortable Eyes: PERRL, lids and conjunctivae normal ENMT: Mucous membranes are moist. Posterior pharynx clear of any exudate or  lesions.Normal dentition.  Neck: normal, supple, no masses, no thyromegaly Respiratory: clear to auscultation bilaterally, no wheezing, no crackles. Normal respiratory effort. No accessory muscle use.  Cardiovascular: Regular rate and rhythm, no murmurs / rubs / gallops. No extremity edema. 2+ pedal pulses. No carotid bruits.  Abdomen: no tenderness, no masses palpated. No hepatosplenomegaly. Bowel sounds positive.  Musculoskeletal: no clubbing / cyanosis. No joint deformity upper and lower extremities. Good ROM, no contractures. Normal muscle tone.  Skin: no rashes, lesions, ulcers. No induration Neurologic: CN 2-12 grossly intact. Sensation intact, DTR normal. Strength 5/5 in all 4.  Psychiatric: Normal judgment and insight. Alert and oriented x 3. Normal mood.    Labs on Admission: I have personally reviewed following labs and imaging studies  CBC: Recent Labs  Lab 04/23/19 0730  WBC 9.9  NEUTROABS 8.0*  HGB 13.1  HCT 41.2  MCV 83.6  PLT 82*   Basic Metabolic Panel: Recent Labs  Lab 04/23/19 0730  NA 138  K 3.3*  CL 102  CO2 24  GLUCOSE 137*  BUN 13  CREATININE 0.74  CALCIUM 8.8*   GFR: Estimated Creatinine Clearance: 134.6 mL/min (by C-G formula based on SCr of 0.74 mg/dL). Liver Function Tests: Recent Labs  Lab 04/23/19 0730  AST 45*  ALT 33  ALKPHOS 113  BILITOT 0.6  PROT 7.3  ALBUMIN 3.3*  No results for input(s): LIPASE, AMYLASE in the last 168 hours. No results for input(s): AMMONIA in the last 168 hours. Coagulation Profile: Recent Labs  Lab 04/23/19 0730  INR 1.1   Cardiac Enzymes: No results for input(s): CKTOTAL, CKMB, CKMBINDEX, TROPONINI in the last 168 hours. BNP (last 3 results) No results for input(s): PROBNP in the last 8760 hours. HbA1C: No results for input(s): HGBA1C in the last 72 hours. CBG: No results for input(s): GLUCAP in the last 168 hours. Lipid Profile: No results for input(s): CHOL, HDL, LDLCALC, TRIG, CHOLHDL,  LDLDIRECT in the last 72 hours. Thyroid Function Tests: No results for input(s): TSH, T4TOTAL, FREET4, T3FREE, THYROIDAB in the last 72 hours. Anemia Panel: No results for input(s): VITAMINB12, FOLATE, FERRITIN, TIBC, IRON, RETICCTPCT in the last 72 hours. Urine analysis:    Component Value Date/Time   COLORURINE YELLOW 04/23/2019 1510   APPEARANCEUR CLEAR 04/23/2019 1510   LABSPEC 1.025 04/23/2019 1510   PHURINE 6.0 04/23/2019 1510   GLUCOSEU 100 (A) 04/23/2019 1510   HGBUR TRACE (A) 04/23/2019 1510   BILIRUBINUR NEGATIVE 04/23/2019 1510   KETONESUR NEGATIVE 04/23/2019 1510   PROTEINUR 100 (A) 04/23/2019 1510   UROBILINOGEN 0.2 11/21/2010 2214   NITRITE NEGATIVE 04/23/2019 1510   LEUKOCYTESUR NEGATIVE 04/23/2019 1510    Radiological Exams on Admission: Dg Chest 2 View  Result Date: 04/23/2019 CLINICAL DATA:  Sepsis EXAM: CHEST - 2 VIEW COMPARISON:  None. FINDINGS: Consolidation versus mass at the right apex. Indistinct opacity at the lung bases. No edema, effusion, or pneumothorax. Normal heart size and mediastinal contours. IMPRESSION: Pneumonia versus nodule at the right apex. Streaky opacity at the bases from atelectasis or bronchopneumonia. Followup PA and lateral chest X-ray is recommended in 3-4 weeks following trial of antibiotic therapy to ensure resolution and exclude mass. Electronically Signed   By: Monte Fantasia M.D.   On: 04/23/2019 05:32   Ct Soft Tissue Neck Wo Contrast  Result Date: 04/23/2019 CLINICAL DATA:  Right-sided neck pain and fever. Bilateral eye redness. Suspected sepsis. IV drug abuse with no IV access EXAM: CT NECK WITHOUT CONTRAST TECHNIQUE: Multidetector CT imaging of the neck was performed following the standard protocol without intravenous contrast. COMPARISON:  None. FINDINGS: Pharynx and larynx: Normal. No mass or swelling. Salivary glands: No inflammation, mass, or stone. Thyroid: Normal. Lymph nodes: None enlarged or abnormal density. Vascular:  Negative Limited intracranial: Negative. Visualized orbits: Normal Mastoids and visualized paranasal sinuses: Clear Skeleton: Right C3-4 facet arthropathy with subchondral erosion and anterolisthesis. Subtle but convincing regional soft tissue edema, which is remarkable for a noncontrast CT. No gross collection. Upper chest: Consolidation in the right upper lobe without cavitation. IMPRESSION: 1. C3-4 erosive right facet arthropathy with anterolisthesis. Findings primarily worrisome for septic arthritis given history of IV drug abuse and degree of regional soft tissue edema. MRI with contrast would be the best imaging study for full characterization. 2. Right upper lobe pneumonia without cavitation. Electronically Signed   By: Monte Fantasia M.D.   On: 04/23/2019 05:47    EKG: Independently reviewed.  Assessment/Plan Principal Problem:   Septic arthritis of cervical spine (HCC) Active Problems:   Right upper lobe pneumonia   Bacteremia due to Staphylococcus aureus    1. Septic arthritis of cervical spine - 1. Soft collar 2. MRI c-spine ordered and pending 3. NS consult based on MRI results 4. Pain control: Fentanyl PCA 2. MSSA bacteremia - 1. Also presumably cause of Septic arthritis of C-spine 2. Will put patient  on Ancef per pharm consult for the moment 3. Consult ID in AM to make sure they dont have any other ABx they want ordered 3. RUL PNA - 1. Ancef as above for now 2. Did get 1 day worth of levaquin, cefepime, vanc, and single dose of doxy yesterday.  DVT prophylaxis: Lovenox Code Status: Full Family Communication: No family in room Disposition Plan: Home after admit Consults called: None, call ID and NS in AM Admission status: Admit to inpatient  Severity of Illness: The appropriate patient status for this patient is INPATIENT. Inpatient status is judged to be reasonable and necessary in order to provide the required intensity of service to ensure the patient's safety. The  patient's presenting symptoms, physical exam findings, and initial radiographic and laboratory data in the context of their chronic comorbidities is felt to place them at high risk for further clinical deterioration. Furthermore, it is not anticipated that the patient will be medically stable for discharge from the hospital within 2 midnights of admission. The following factors support the patient status of inpatient.   IP status for treatment of MSSA bacteremia as well as septic arthritis of C spine.   * I certify that at the point of admission it is my clinical judgment that the patient will require inpatient hospital care spanning beyond 2 midnights from the point of admission due to high intensity of service, high risk for further deterioration and high frequency of surveillance required.*    Kenneshia Rehm M. DO [facility]  How to contact the [facility] Attending or Consulting provider 7A - 7P or covering provider during after hours 7P -7A, for this patient?  1. Check the care team in [facility] and look for a) attending/consulting [facility] provider listed and b) the [facility] team listed 2. Log into [email or web address]  Amion Physician Scheduling and messaging for groups and whole hospitals  On call and physician scheduling software for group practices, residents, hospitalists and other medical providers for call, clinic, rotation and shift schedules. OnCall [employer] is a hospital-wide system for scheduling doctors and paging doctors on call. EasyPlot is for scientific plotting and data analysis.  [email or web address]  and use [place]'s universal password to access. If you do not have the password, please contact the hospital operator.  3. Locate the [facility] provider you are looking for under [facility] and page to a number that you can be directly reached. 4. If you still have difficulty reaching the provider, please page the [facility] (Director on Call) for the Hospitalists listed on amion for assistance.   04/24/2019, 3:55 AM

## 2019-04-24 NOTE — Progress Notes (Signed)
PROGRESS NOTE    Christopher Shields  HGD:[id number] DOB: [birth year] DOA: 04/23/2019 PCP: Patient, No Pcp Per    Brief Narrative:  43 year old male who presented with neck pain.  He does have significant past medical history for intravenous drug abuse.  Reported 1-1/2 days of right-sided neck pain, severe intensity, associated with fever.  On his initial physical examination blood pressure 126/76, pulse rate 115, respiratory 26, temperature 99.1, oxygen saturation 94%, moist Lucas membranes, his lungs are clear to auscultation bilaterally, heart S1-S2 present and rhythmic, abdomen soft, no lower extremity edema.  Sodium 138, potassium 3.3, chloride 102, bicarb 24, glucose 137, BUN 13, creatinine 0.74, white count 9.9, hemoglobin 13.1, hematocrit 41.2, platelets 88.  SARS COVID-19 was negative, urinalysis negative for infection.  Station CT with C3-4 erosive right facet arthropathy, with anterolisthesis, suggestive septic arthritis.  Right upper lobe pneumonia without cavitation.  Chest x-ray with a right upper lobe infiltrate.  EKG 834 bpm, normal axis, normal intervals, sinus rhythm, no ST segment T wave changes.  Patient was admitted to the hospital and diagnosis of sepsis due to C3-4 septic arthritis, complicated with right upper lobe infiltrate.    Patient has been placed on broad spectrum antibiotic and PCA pump for analgesia. Continue to have uncontrolled neck pain.    Assessment & Plan:   Principal Problem:   Septic arthritis of cervical spine (HCC) Active Problems:   Right upper lobe pneumonia   Bacteremia due to Staphylococcus aureus   1. Sepsis due to C3-C4 septic arthritis, complicated with right upper lobe pneumonia/ suspected septic embolization/ MSSA bacteremia. This am patient continue to be in pain, positive tachypnea. His blood cultures are positive for MSSA. This am not able to get MRI due to uncontrolled cervical pain. Will continue antibiotic therapy with cefazolin, check  echocardiogram, and follow cell count, repeat cultures and temperature curve.  Patient with very high tolerance to opioid analgesics due to drug abuse. Will change PCA pump to hydromorphone pump from fentanyl. Once pain is more controlled with get cervical spine MRI. Keep C collar for now.  2. Substance abuse. Patient with high tolerance to opioid analgesics, with high risk for withdrawal. Will continue neuro checks per unit protocol.   3. Thrombocytopenia. Likely reactive, will continue to follow on cell count while hospitalized.   4. Hypokalemia. K down to 3,3 on admission, renal function preserved with serum cr at 0,74. K correction with oral KCl, follow on renal panel in am.   DVT prophylaxis: sdc/ enoxaparin   Code Status: full Family Communication: no family at the bedside  Disposition Plan/ discharge barriers: pending clinical improvement.   Body mass index is 25.74 kg/m. Malnutrition Type:      Malnutrition Characteristics:      Nutrition Interventions:     RN Pressure Injury Documentation:     Consultants:     Procedures:     Antimicrobials:   Cefazolin     Subjective: Patient with severe neck pain, 10/10 intensity, sharp in nature, no improving or worsening factors, no radiation, no associated nausea or vomiting.   Objective: Vitals:   04/24/19 0438 04/24/19 0705 04/24/19 0757 04/24/19 0811  BP:  121/71 131/89   Pulse:      Resp: (!) 26 20 (!) 29 (!) 24  Temp:  99.1 F (37.3 C) 98.9 F (37.2 C)   TempSrc:  Oral Oral   SpO2: 95% 95% 97% 97%  Weight:      Height:  Intake/Output Summary (Last 24 hours) at 04/24/2019 0830 Last data filed at 04/24/2019 0600 Gross per 24 hour  Intake 4036.77 ml  Output 1500 ml  Net 2536.77 ml   Filed Weights   04/23/19 0304  Weight: 88.5 kg    Examination:   General: Not in pain or dyspnea, deconditioned  Neurology: Awake and alert, non focal  E ENT: positive pallor, no icterus, oral mucosa  moist Cardiovascular: No JVD. S1-S2 present, rhythmic, no gallops, rubs, or murmurs. No lower extremity edema. Pulmonary: positive breath sounds bilaterally, adequate air movement, no wheezing, rhonchi or rales. Gastrointestinal. Abdomen with no organomegaly, non tender, no rebound or guarding Skin. No rashes Musculoskeletal: no joint deformities     Data Reviewed: I have personally reviewed following labs and imaging studies  CBC: Recent Labs  Lab 04/23/19 0730  WBC 9.9  NEUTROABS 8.0*  HGB 13.1  HCT 41.2  MCV 83.6  PLT 82*   Basic Metabolic Panel: Recent Labs  Lab 04/23/19 0730  NA 138  K 3.3*  CL 102  CO2 24  GLUCOSE 137*  BUN 13  CREATININE 0.74  CALCIUM 8.8*   GFR: Estimated Creatinine Clearance: 134.6 mL/min (by C-G formula based on SCr of 0.74 mg/dL). Liver Function Tests: Recent Labs  Lab 04/23/19 0730  AST 45*  ALT 33  ALKPHOS 113  BILITOT 0.6  PROT 7.3  ALBUMIN 3.3*   No results for input(s): LIPASE, AMYLASE in the last 168 hours. No results for input(s): AMMONIA in the last 168 hours. Coagulation Profile: Recent Labs  Lab 04/23/19 0730  INR 1.1   Cardiac Enzymes: No results for input(s): CKTOTAL, CKMB, CKMBINDEX, TROPONINI in the last 168 hours. BNP (last 3 results) No results for input(s): PROBNP in the last 8760 hours. HbA1C: No results for input(s): HGBA1C in the last 72 hours. CBG: No results for input(s): GLUCAP in the last 168 hours. Lipid Profile: No results for input(s): CHOL, HDL, LDLCALC, TRIG, CHOLHDL, LDLDIRECT in the last 72 hours. Thyroid Function Tests: No results for input(s): TSH, T4TOTAL, FREET4, T3FREE, THYROIDAB in the last 72 hours. Anemia Panel: No results for input(s): VITAMINB12, FOLATE, FERRITIN, TIBC, IRON, RETICCTPCT in the last 72 hours.    Radiology Studies: I have reviewed all of the imaging during this hospital visit personally     Scheduled Meds: . enoxaparin (LOVENOX) injection  40 mg  Subcutaneous Q24H  . fentaNYL   Intravenous Q4H  . ondansetron  8 mg Oral Once   Continuous Infusions: . sodium chloride 10 mL/hr at 04/24/19 0134  .  ceFAZolin (ANCEF) IV 2 g (04/24/19 0453)     LOS: 1 day        Mauricio Annett Gula, MD

## 2019-04-24 NOTE — Progress Notes (Signed)
Pt is refuses MRI and blood draw today.

## 2019-04-24 NOTE — Progress Notes (Signed)
Sent message to Dr. Cathlean Sauer stating pt has mri scheduled but he can't go on PCA. Need dose of med to cover him.  Also, pt tachypneic and on oxygen.

## 2019-04-24 NOTE — Progress Notes (Signed)
Patient has taken his leads off twice to use the bedside commode, pt has been instructed 3 times to leave his monitor equipment on.  Also, I have walked in the room and had error messages on his PCA, they do not appear to be related to his Co2 or breathing.

## 2019-04-24 NOTE — Progress Notes (Signed)
*  PRELIMINARY RESULTS* Echocardiogram 2D Echocardiogram has been performed.  Christopher Shields 04/24/2019, 3:34 PM

## 2019-04-24 NOTE — Progress Notes (Signed)
Pharmacy Antibiotic Note  Christopher Shields is a 43 y.o. male admitted on 04/23/2019 with bacteremia, septic arthritis. Pharmacy has been consulted for ancef dosing.  Plan: Ancef 2gm IV q8 hours F/u with ID recommendations  Height: 6\' 1"  (185.4 cm) Weight: 195 lb 1.7 oz (88.5 kg) IBW/kg (Calculated) : 79.9  Temp (24hrs), Avg:99.6 F (37.6 C), Min:99.1 F (37.3 C), Max:99.9 F (37.7 C)  Recent Labs  Lab 04/23/19 0521 04/23/19 0730  WBC  --  9.9  CREATININE  --  0.74  LATICACIDVEN 1.1  --     Estimated Creatinine Clearance: 134.6 mL/min (by C-G formula based on SCr of 0.74 mg/dL).    No Known Allergies   Thank you for allowing pharmacy to be a part of this patient's care.  Excell Seltzer Poteet 04/24/2019 3:37 AM

## 2019-04-24 NOTE — Progress Notes (Signed)
Pt arrived via stretcher by [facility]. Pt is A&OX3, MAE well. Pt C/O 9/10 pain to right neck. Pt was given fentanyl 174mcg IVP. VS taken. Placed on PC monitor. POC reviewed. PCA fentanyl to be initiated.

## 2019-04-25 ENCOUNTER — Inpatient Hospital Stay (HOSPITAL_COMMUNITY): Payer: Self-pay

## 2019-04-25 DIAGNOSIS — D696 Thrombocytopenia, unspecified: Secondary | ICD-10-CM

## 2019-04-25 DIAGNOSIS — J181 Lobar pneumonia, unspecified organism: Secondary | ICD-10-CM

## 2019-04-25 DIAGNOSIS — H1131 Conjunctival hemorrhage, right eye: Secondary | ICD-10-CM

## 2019-04-25 DIAGNOSIS — F1990 Other psychoactive substance use, unspecified, uncomplicated: Secondary | ICD-10-CM

## 2019-04-25 DIAGNOSIS — D649 Anemia, unspecified: Secondary | ICD-10-CM

## 2019-04-25 LAB — CULTURE, BLOOD (ROUTINE X 2)
Special Requests: ADEQUATE
Special Requests: ADEQUATE

## 2019-04-25 MED ORDER — ONDANSETRON HCL 4 MG/2ML IJ SOLN: 4.0000 mg | Freq: Four times a day (QID) | INTRAMUSCULAR | Status: DC | PRN

## 2019-04-25 MED ORDER — FENTANYL 40 MCG/ML IV SOLN
INTRAVENOUS | Status: DC
  Administered 2019-04-25: 300 ug via INTRAVENOUS
  Administered 2019-04-25: 1000 ug via INTRAVENOUS
  Filled 2019-04-25: qty 25

## 2019-04-25 MED ORDER — DIPHENHYDRAMINE HCL 12.5 MG/5ML PO ELIX: 12.5000 mg | ORAL_SOLUTION | Freq: Four times a day (QID) | ORAL | Status: DC | PRN

## 2019-04-25 MED ORDER — FENTANYL CITRATE (PF) 100 MCG/2ML IJ SOLN
50.0000 ug | Freq: Once | INTRAMUSCULAR | Status: AC
  Administered 2019-04-25: 50 ug via INTRAVENOUS
  Filled 2019-04-25: qty 2

## 2019-04-25 MED ORDER — FENTANYL 40 MCG/ML IV SOLN
INTRAVENOUS | Status: DC
  Administered 2019-04-25: 290 ug via INTRAVENOUS
  Administered 2019-04-25 (×2): via INTRAVENOUS
  Administered 2019-04-26: 04:00:00 1000 ug via INTRAVENOUS
  Filled 2019-04-25: qty 25

## 2019-04-25 MED ORDER — NALOXONE HCL 0.4 MG/ML IJ SOLN: 0.4000 mg | INTRAMUSCULAR | Status: DC | PRN

## 2019-04-25 MED ORDER — SODIUM CHLORIDE 0.9% FLUSH: 9.0000 mL | INTRAVENOUS | Status: DC | PRN

## 2019-04-25 MED ORDER — DIPHENHYDRAMINE HCL 50 MG/ML IJ SOLN: 12.5000 mg | Freq: Four times a day (QID) | INTRAMUSCULAR | Status: DC | PRN

## 2019-04-25 NOTE — Progress Notes (Signed)
I spoke with neurosurgery on call, Dr. Ronnald Ramp, 1 cc collection seems to be very small even for possible IR drainage.  The rigid cervical collar may help with spine stabilization and pain control.

## 2019-04-25 NOTE — Progress Notes (Signed)
PCA syringe change, wasted 25cc with Rodman Pickle, RN into stericycle.

## 2019-04-25 NOTE — Progress Notes (Signed)
Changed hydromorphone to fentanyl. Wasted 28 mls  of hydromorphone in the stericyce.

## 2019-04-25 NOTE — Progress Notes (Signed)
Confirming change of hydromorphone to fentanyl PCA and waste of 78mls hydromorphone into stericycle with Ramonita Lab, RN.

## 2019-04-25 NOTE — Progress Notes (Addendum)
PROGRESS NOTE    Christopher Shields  ZOX:[id number] DOB: 06-10-1976 DOA: 04/23/2019 PCP: Patient, No Pcp Per    Brief Narrative:  43 year old male who presented with neck pain.  He does have significant past medical history for intravenous drug abuse.  Reported 1-1/2 days of right-sided neck pain, severe intensity, associated with fever.  On his initial physical examination blood pressure 126/76, pulse rate 115, respiratory 26, temperature 99.1, oxygen saturation 94%, moist Lucas membranes, his lungs are clear to auscultation bilaterally, heart S1-S2 present and rhythmic, abdomen soft, no lower extremity edema.  Sodium 138, potassium 3.3, chloride 102, bicarb 24, glucose 137, BUN 13, creatinine 0.74, white count 9.9, hemoglobin 13.1, hematocrit 41.2, platelets 88.  SARS COVID-19 was negative, urinalysis negative for infection.  Station CT with C3-4 erosive right facet arthropathy, with anterolisthesis, suggestive septic arthritis.  Right upper lobe pneumonia without cavitation.  Chest x-ray with a right upper lobe infiltrate.  EKG 834 bpm, normal axis, normal intervals, sinus rhythm, no ST segment T wave changes.  Patient was admitted to the hospital and diagnosis of sepsis due to C3-4 septic arthritis, complicated with right upper lobe infiltrate.    Patient has been placed on broad spectrum antibiotic and PCA pump for analgesia. Continue to have uncontrolled neck pain.     Assessment & Plan:   Principal Problem:   Septic arthritis of cervical spine (HCC) Active Problems:   Right upper lobe pneumonia   Bacteremia due to methicillin susceptible Staphylococcus aureus (MSSA)   Active intravenous drug use   Normocytic anemia   Thrombocytopenia (HCC)   Cigarette smoker   S/P splenectomy    1. Sepsis due to C3-C4 septic arthritis, complicated with right upper lobe pneumonia/ suspected septic embolization/ MSSA bacteremia. Spine MRI with 1 cc fluid collection in the right longissimus capitis  muscle, likely abscess, positive effusion right C3 and C4 facet joint. Positive inflammatory phlegmon extending in the regional musculature.  Echocardiogram with no obvious valvular vegetations.   Considering possible embolic phenomena to the lungs patient may need TEE, will follow with ID recommendations.   Will consult spine surgery for further recommendations, including removal of cervical collar.   Continue antibiotic therapy with cefazolin for now, follow on further culture, temperature curve and cell count. Today not able to obtain labs.   Patient continue to have pain despite  PCA pump to hydromorphone, will change back to fentanyl per his request.    2. Substance abuse with acute withdrawal. Continue fentanyl PCA pump, and as needed lorazepam po. His withdrawal symptoms seem to be better today, but continue to have tremors and anxiety.   3. Thrombocytopenia. Follow on cell count in am.    4. Hypokalemia. Close follow up of renal functio and electrolytes, unable to get labs today, difficult access.    DVT prophylaxis: sdc/ enoxaparin   Code Status: full Family Communication: no family at the bedside  Disposition Plan/ discharge barriers: pending clinical improvement.      Body mass index is 25.74 kg/m. Malnutrition Type:      Malnutrition Characteristics:      Nutrition Interventions:     RN Pressure Injury Documentation:     Consultants:   ID   Procedures:     Antimicrobials:   Cefazolin.     Subjective: Patient continue to have neck pain, moderate to severe, despite hydromorphone PCA pump, positive withdrawals symptoms that improved with lorazepam. No nausea or vomiting.   Objective: Vitals:   04/25/19 0407 04/25/19 8119 04/25/19 1478  04/25/19 0815  BP:   129/89   Pulse:      Resp:  20 (!) 27 (!) 21  Temp: 97.7 F (36.5 C)  99.4 F (37.4 C)   TempSrc: Oral  Oral   SpO2:      Weight:      Height:        Intake/Output Summary  (Last 24 hours) at 04/25/2019 2458 Last data filed at 04/25/2019 0029 Gross per 24 hour  Intake 305.1 ml  Output 950 ml  Net -644.9 ml   Filed Weights   04/23/19 0304  Weight: 88.5 kg    Examination:   General: Not in pain or dyspnea, deconditioned  Neurology: Awake and alert, non focal  E ENT: no pallor, no icterus, oral mucosa moist/ cervical collar in place.  Cardiovascular: No JVD. S1-S2 present, rhythmic, no gallops, rubs, or murmurs. No lower extremity edema. Pulmonary: positive breath  sounds bilaterally, adequate air movement, no wheezing, rhonchi or rales. Gastrointestinal. Abdomen  With no organomegaly, non tender, no rebound or guarding Skin. No rashes Musculoskeletal: no joint deformities     Data Reviewed: I have personally reviewed following labs and imaging studies  CBC: Recent Labs  Lab 04/23/19 0730 04/24/19 0848  WBC 9.9 11.4*  NEUTROABS 8.0*  --   HGB 13.1 12.7*  HCT 41.2 39.0  MCV 83.6 81.1  PLT 82* 099*   Basic Metabolic Panel: Recent Labs  Lab 04/23/19 0730 04/24/19 0848  NA 138  --   K 3.3*  --   CL 102  --   CO2 24  --   GLUCOSE 137*  --   BUN 13  --   CREATININE 0.74 0.66  CALCIUM 8.8*  --    GFR: Estimated Creatinine Clearance: 134.6 mL/min (by C-G formula based on SCr of 0.66 mg/dL). Liver Function Tests: Recent Labs  Lab 04/23/19 0730  AST 45*  ALT 33  ALKPHOS 113  BILITOT 0.6  PROT 7.3  ALBUMIN 3.3*   No results for input(s): LIPASE, AMYLASE in the last 168 hours. No results for input(s): AMMONIA in the last 168 hours. Coagulation Profile: Recent Labs  Lab 04/23/19 0730  INR 1.1   Cardiac Enzymes: No results for input(s): CKTOTAL, CKMB, CKMBINDEX, TROPONINI in the last 168 hours. BNP (last 3 results) No results for input(s): PROBNP in the last 8760 hours. HbA1C: No results for input(s): HGBA1C in the last 72 hours. CBG: No results for input(s): GLUCAP in the last 168 hours. Lipid Profile: No results for  input(s): CHOL, HDL, LDLCALC, TRIG, CHOLHDL, LDLDIRECT in the last 72 hours. Thyroid Function Tests: No results for input(s): TSH, T4TOTAL, FREET4, T3FREE, THYROIDAB in the last 72 hours. Anemia Panel: No results for input(s): VITAMINB12, FOLATE, FERRITIN, TIBC, IRON, RETICCTPCT in the last 72 hours.    Radiology Studies: I have reviewed all of the imaging during this hospital visit personally     Scheduled Meds: . enoxaparin (LOVENOX) injection  40 mg Subcutaneous Q24H  . HYDROmorphone   Intravenous Q4H  . ondansetron  8 mg Oral Once   Continuous Infusions: . sodium chloride 10 mL/hr at 04/24/19 0134  .  ceFAZolin (ANCEF) IV 2 g (04/25/19 0629)     LOS: 2 days        Mauricio Gerome Apley, MD

## 2019-04-25 NOTE — Progress Notes (Signed)
Patient continued to have pain despite current settings of fentanyl PCA pump.  I have consulted pharmacy to increase dose.  Continue close neurologic monitoring at the progressive care unit.   Patient declined he is hydromorphone PCA pump

## 2019-04-25 NOTE — Progress Notes (Signed)
Patient ID: Christopher CurlJeremy Shields, male   DOB: 20-Mar-1976, 43 y.o.   MRN: [id number]         [facility]  Date of Admission:  04/23/2019   Total days of antibiotics 4        Day 3 cefazolin         ASSESSMENT: He has MSSA bacteremia complicated by C-spine infection and right upper lobe pneumonia.  There is no evidence of endocarditis on TTE.  I would not pursue a TEE since he already has an indication for prolonged antibiotic therapy.  Hopefully he will agree to stay in a supervised setting to complete therapy.  PLAN: 1. Continue cefazolin 2. Await results of repeat blood cultures 3. Attempt to manage pain and withdrawal  Principal Problem:   Bacteremia due to methicillin susceptible Staphylococcus aureus (MSSA) Active Problems:   Septic arthritis of cervical spine (HCC)   Right upper lobe pneumonia   Active intravenous drug use   Normocytic anemia   Thrombocytopenia (HCC)   Cigarette smoker   S/P splenectomy   Scheduled Meds: . enoxaparin (LOVENOX) injection  40 mg Subcutaneous Q24H  . fentaNYL   Intravenous Q4H  . ondansetron  8 mg Oral Once   Continuous Infusions: . sodium chloride 10 mL/hr at 04/24/19 0134  .  ceFAZolin (ANCEF) IV 2 g (04/25/19 1440)   PRN Meds:.sodium chloride, diphenhydrAMINE **OR** diphenhydrAMINE, LORazepam, naloxone **AND** sodium chloride flush, ondansetron (ZOFRAN) IV   SUBJECTIVE: He tells me that if he does not get stronger pain medication he will have to leave the hospital soon.  Review of Systems: Review of Systems  Constitutional: Negative for fever.  Respiratory: Positive for cough, hemoptysis and sputum production. Negative for shortness of breath.   Cardiovascular: Negative for chest pain.  Gastrointestinal: Positive for nausea. Negative for abdominal pain, diarrhea and vomiting.  Musculoskeletal: Positive for neck pain.    No Known Allergies  OBJECTIVE: Vitals:   04/25/19 1208 04/25/19 1238 04/25/19 1253  04/25/19 1325  BP: 116/75 127/79 114/78   Pulse:      Resp: 20 (!) 22 (!) 27 20  Temp:      TempSrc:      SpO2:      Weight:      Height:       Body mass index is 25.74 kg/m.  Physical Exam Constitutional:      Comments: He is alert and slightly shaky.  Eyes:     Comments: Right conjunctival hemorrhage.  Neck:     Comments: Hard collar in place. Cardiovascular:     Rate and Rhythm: Normal rate and regular rhythm.     Heart sounds: No murmur.  Pulmonary:     Effort: Pulmonary effort is normal.     Breath sounds: Normal breath sounds.     Lab Results Lab Results  Component Value Date   WBC 11.4 (H) 04/24/2019   HGB 12.7 (L) 04/24/2019   HCT 39.0 04/24/2019   MCV 81.1 04/24/2019   PLT 123 (L) 04/24/2019    Lab Results  Component Value Date   CREATININE 0.66 04/24/2019   BUN 13 04/23/2019   NA 138 04/23/2019   K 3.3 (L) 04/23/2019   CL 102 04/23/2019   CO2 24 04/23/2019    Lab Results  Component Value Date   ALT 33 04/23/2019   AST 45 (H) 04/23/2019   ALKPHOS 113 04/23/2019   BILITOT 0.6 04/23/2019     Microbiology: Recent Results (from the past  240 hour(s))  Culture, blood (Routine x 2)     Status: Abnormal   Collection Time: 04/23/19  3:34 AM   Specimen: BLOOD RIGHT ARM  Result Value Ref Range Status   Specimen Description   Final    BLOOD RIGHT ARM Performed at [facility], [address] [place], [place] [address]    Special Requests   Final    BOTTLES DRAWN AEROBIC AND ANAEROBIC Blood Culture adequate volume Performed at [facility], [address] [place], [place] [address]    Culture  Setup Time   Final    IN BOTH AEROBIC AND ANAEROBIC BOTTLES GRAM POSITIVE COCCI CRITICAL VALUE NOTED.  VALUE IS CONSISTENT WITH PREVIOUSLY REPORTED AND CALLED VALUE.    Culture (A)  Final    STAPHYLOCOCCUS AUREUS SUSCEPTIBILITIES PERFORMED ON PREVIOUS CULTURE WITHIN THE LAST 5 DAYS. Performed at [facility], [address], [place], [place] [address]    Report Status 04/25/2019 FINAL  Final  Culture, blood (Routine x 2)     Status: Abnormal   Collection Time: 04/23/19  4:45 AM   Specimen: BLOOD  Result Value Ref Range Status   Specimen Description   Final    BLOOD RIGHT KNEE Performed at [facility], [address] [place], [place] [address]    Special Requests   Final    BOTTLES DRAWN AEROBIC AND ANAEROBIC Blood Culture adequate volume Performed at [facility], [address] [place], [place] [address]    Culture  Setup Time   Final    IN BOTH AEROBIC AND ANAEROBIC BOTTLES GRAM POSITIVE COCCI CRITICAL RESULT CALLED TO, READ BACK BY AND VERIFIED WITH: Jamal Collin RN 04/23/19 2255 JDW Performed at [facility], [address] [place], [place] [address]    Culture STAPHYLOCOCCUS AUREUS (A)  Final   Report Status 04/25/2019 FINAL  Final   Organism ID, Bacteria STAPHYLOCOCCUS AUREUS  Final      Susceptibility   Staphylococcus aureus - MIC*    CIPROFLOXACIN <=0.5 SENSITIVE Sensitive     ERYTHROMYCIN <=0.25 SENSITIVE Sensitive     GENTAMICIN <=0.5 SENSITIVE Sensitive     OXACILLIN 0.5 SENSITIVE Sensitive     TETRACYCLINE >=16 RESISTANT Resistant     VANCOMYCIN 1 SENSITIVE Sensitive     TRIMETH/SULFA <=10 SENSITIVE Sensitive     CLINDAMYCIN <=0.25 SENSITIVE Sensitive     RIFAMPIN <=0.5 SENSITIVE Sensitive     Inducible Clindamycin NEGATIVE Sensitive     * STAPHYLOCOCCUS AUREUS  Blood Culture ID Panel (Reflexed)     Status: Abnormal   Collection Time: 04/23/19  4:45 AM  Result Value Ref Range Status   Enterococcus species NOT DETECTED NOT DETECTED Final   Listeria monocytogenes NOT DETECTED NOT DETECTED Final   Staphylococcus species DETECTED (A) NOT DETECTED Final    Comment: CRITICAL RESULT CALLED TO, READ BACK BY AND VERIFIED WITH: Jamal Collin RN 04/23/19 2255 JDW    Staphylococcus aureus (BCID) DETECTED (A) NOT DETECTED Final    Comment: Methicillin (oxacillin)  susceptible Staphylococcus aureus (MSSA). Preferred therapy is anti staphylococcal beta lactam antibiotic (Cefazolin or Nafcillin), unless clinically contraindicated. CRITICAL RESULT CALLED TO, READ BACK BY AND VERIFIED WITH: Jamal Collin RN 04/23/19 2255 JDW    Methicillin resistance NOT DETECTED NOT DETECTED Final   Streptococcus species NOT DETECTED NOT DETECTED Final   Streptococcus agalactiae NOT DETECTED NOT DETECTED Final   Streptococcus pneumoniae NOT DETECTED  NOT DETECTED Final   Streptococcus pyogenes NOT DETECTED NOT DETECTED Final   Acinetobacter baumannii NOT DETECTED NOT DETECTED Final   Enterobacteriaceae species NOT DETECTED NOT DETECTED Final   Enterobacter cloacae complex NOT DETECTED NOT DETECTED Final   Escherichia coli NOT DETECTED NOT DETECTED Final   Klebsiella oxytoca NOT DETECTED NOT DETECTED Final   Klebsiella pneumoniae NOT DETECTED NOT DETECTED Final   Proteus species NOT DETECTED NOT DETECTED Final   Serratia marcescens NOT DETECTED NOT DETECTED Final   Haemophilus influenzae NOT DETECTED NOT DETECTED Final   Neisseria meningitidis NOT DETECTED NOT DETECTED Final   Pseudomonas aeruginosa NOT DETECTED NOT DETECTED Final   Candida albicans NOT DETECTED NOT DETECTED Final   Candida glabrata NOT DETECTED NOT DETECTED Final   Candida krusei NOT DETECTED NOT DETECTED Final   Candida parapsilosis NOT DETECTED NOT DETECTED Final   Candida tropicalis NOT DETECTED NOT DETECTED Final    Comment: Performed at [facility], [place] [address] [place], [place] [address]  SARS CORONAVIRUS 2 (TAT 6-24 HRS) Nasopharyngeal Nasopharyngeal Swab     Status: None   Collection Time: 04/23/19  9:52 AM   Specimen: Nasopharyngeal Swab  Result Value Ref Range Status   SARS Coronavirus 2 NEGATIVE NEGATIVE Final    Comment: (NOTE) SARS-CoV-2 target nucleic acids are NOT DETECTED. The SARS-CoV-2 RNA is generally detectable in upper and lower respiratory specimens during the acute phase  of infection. Negative results do not preclude SARS-CoV-2 infection, do not rule out co-infections with other pathogens, and should not be used as the sole basis for treatment or other patient management decisions. Negative results must be combined with clinical observations, patient history, and epidemiological information. The expected result is Negative. Fact Sheet for Patients: SugarRoll.be Fact Sheet for Healthcare Providers: https://www.woods-mathews.com/ This test is not yet approved or cleared by the [place] FDA and  has been authorized for detection and/or diagnosis of SARS-CoV-2 by FDA under an Emergency Use Authorization (EUA). This EUA will remain  in effect (meaning this test can be used) for the duration of the COVID-19 declaration under Section 56 4(b)(1) of the Act, 21 U.S.C. section 360bbb-3(b)(1), unless the authorization is terminated or revoked sooner. Performed at [facility], [place] [address], [place], [place] [address]   Culture, blood (single)     Status: None (Preliminary result)   Collection Time: 04/24/19  5:41 AM   Specimen: BLOOD  Result Value Ref Range Status   Specimen Description BLOOD RIGHT ANTECUBITAL  Final   Special Requests   Final    BOTTLES DRAWN AEROBIC AND ANAEROBIC Blood Culture results may not be optimal due to an inadequate volume of blood received in culture bottles   Culture   Final    NO GROWTH 1 DAY Performed at [facility], [place] [address], [place], [place] [address]    Report Status PENDING  Incomplete    Michel Bickers, MD [facility] [phone] pager   [phone] cell 04/25/2019, 3:17 PM

## 2019-04-26 ENCOUNTER — Telehealth: Payer: Self-pay | Admitting: Infectious Diseases

## 2019-04-26 DIAGNOSIS — Z1629 Resistance to other single specified antibiotic: Secondary | ICD-10-CM

## 2019-04-26 DIAGNOSIS — F1120 Opioid dependence, uncomplicated: Secondary | ICD-10-CM

## 2019-04-26 DIAGNOSIS — F1910 Other psychoactive substance abuse, uncomplicated: Secondary | ICD-10-CM

## 2019-04-26 DIAGNOSIS — M4650 Other infective spondylopathies, site unspecified: Secondary | ICD-10-CM

## 2019-04-26 DIAGNOSIS — G061 Intraspinal abscess and granuloma: Secondary | ICD-10-CM

## 2019-04-26 LAB — MRSA PCR SCREENING: MRSA by PCR: NEGATIVE

## 2019-04-26 LAB — BASIC METABOLIC PANEL
Anion gap: 13 (ref 5–15)
BUN: 10 mg/dL (ref 6–20)
CO2: 22 mmol/L (ref 22–32)
Calcium: 8.3 mg/dL — ABNORMAL LOW (ref 8.9–10.3)
Chloride: 103 mmol/L (ref 98–111)
Creatinine, Ser: 0.7 mg/dL (ref 0.61–1.24)
GFR calc Af Amer: >60 mL/min (ref >60)
GFR calc non Af Amer: >60 mL/min (ref >60)
Glucose, Bld: 147 mg/dL — ABNORMAL HIGH (ref 70–99)
Potassium: 3.2 mmol/L — ABNORMAL LOW (ref 3.5–5.1)
Sodium: 138 mmol/L (ref 135–145)

## 2019-04-26 LAB — CBC WITH DIFFERENTIAL/PLATELET
Abs Immature Granulocytes: 0.49 10*3/uL — ABNORMAL HIGH (ref 0.00–0.07)
Basophils Absolute: 0.1 10*3/uL (ref 0.0–0.1)
Basophils Relative: 1 %
Eosinophils Absolute: 0.1 10*3/uL (ref 0.0–0.5)
Eosinophils Relative: 1 %
HCT: 41.1 % (ref 39.0–52.0)
Hemoglobin: 14.1 g/dL (ref 13.0–17.0)
Immature Granulocytes: 4 %
Lymphocytes Relative: 18 %
Lymphs Abs: 2.1 10*3/uL (ref 0.7–4.0)
MCH: 27.2 pg (ref 26.0–34.0)
MCHC: 34.3 g/dL (ref 30.0–36.0)
MCV: 79.3 fL — ABNORMAL LOW (ref 80.0–100.0)
Monocytes Absolute: 1.1 10*3/uL — ABNORMAL HIGH (ref 0.1–1.0)
Monocytes Relative: 10 %
Neutro Abs: 7.6 10*3/uL (ref 1.7–7.7)
Neutrophils Relative %: 66 %
Platelets: 178 10*3/uL (ref 150–400)
RBC: 5.18 MIL/uL (ref 4.22–5.81)
RDW: 13.8 % (ref 11.5–15.5)
WBC: 11.4 10*3/uL — ABNORMAL HIGH (ref 4.0–10.5)
nRBC: 0 % (ref 0.0–0.2)

## 2019-04-26 MED ORDER — LINEZOLID 600 MG PO TABS: 600.0000 mg | ORAL_TABLET | Freq: Two times a day (BID) | ORAL | 0 refills | Status: AC

## 2019-04-26 MED ORDER — POTASSIUM CHLORIDE CRYS ER 20 MEQ PO TBCR
40.0000 meq | EXTENDED_RELEASE_TABLET | Freq: Once | ORAL | Status: AC
  Administered 2019-04-26: 40 meq via ORAL
  Filled 2019-04-26: qty 2

## 2019-04-26 MED FILL — Sodium Chloride IV Soln 0.9%: INTRAVENOUS | Qty: 5 | Status: AC

## 2019-04-26 MED FILL — Fentanyl Citrate Preservative Free (PF) Inj 1000 MCG/20ML: INTRAMUSCULAR | Qty: 20 | Status: AC

## 2019-04-26 NOTE — Progress Notes (Signed)
PROGRESS NOTE    Donovan Persley  AYT:[id number] DOB: 16-Sep-1975 DOA: 04/23/2019 PCP: Patient, No Pcp Per    Brief Narrative:  43 year old male who presented with neck pain.  He does have significant past medical history for intravenous drug abuse.  Reported 1-1/2 days of right-sided neck pain, severe intensity, associated with fever.   -CT with C3-4 erosive right facet arthropathy, with anterolisthesis, suggestive septic arthritis.  Right upper lobe pneumonia without cavitation.  Patient was admitted to the hospital and diagnosis of sepsis due to C3-4 septic arthritis, complicated with right upper lobe infiltrate.   Patient has been placed on broad spectrum antibiotic and PCA pump for analgesia. Continue to have uncontrolled neck pain.     Assessment & Plan:   Principal Problem:   Bacteremia due to methicillin susceptible Staphylococcus aureus (MSSA) Active Problems:   Septic arthritis of cervical spine (HCC)   Lobar pneumonia, unspecified organism ([place])   Active intravenous drug use   Normocytic anemia   Thrombocytopenia (HCC)   Cigarette smoker   S/P splenectomy    1. Sepsis due to C3-C4 septic arthritis, complicated with right upper lobe pneumonia/ suspected septic embolization/ MSSA bacteremia. Spine MRI with 1 cc fluid collection in the right longissimus capitis muscle, likely abscess, positive effusion right C3 and C4 facet joint. Positive inflammatory phlegmon extending in the regional musculature.  - Dr. Cathlean Sauer spoke with neurosurgery on call, Dr. Ronnald Ramp, 1 cc collection seems to be very small even for possible IR drainage.  The rigid cervical collar may help with spine stabilization and pain control. Echocardiogram with no obvious valvular vegetations.  Continue antibiotic therapy -- will need 6-8 weeks in hospital -appreciate ID consult  2. Substance abuse with acute withdrawal.  -Continue fentanyl PCA pump, and as needed lorazepam po. -? Change to methadone (will ask  pharmacy to verify)  3. Thrombocytopenia. Resolved   4. Hypokalemia. replete    DVT prophylaxis: sdc/ enoxaparin   Code Status: full Family Communication: no family at the bedside  Disposition Plan/ discharge barriers: will need to remain in hospital for at least 6-8 weeks for IV abx      Consultants:   ID    Antimicrobials:   Cefazolin.     Subjective: Sleeping soundly but when awoken, c/o pain not relieved by PCA -says he was on methadone as an outpatient (195mg )  Objective: Vitals:   04/26/19 0715 04/26/19 0743 04/26/19 0801 04/26/19 1000  BP:   130/88 119/70  Pulse:      Resp: (!) 26 (!) 24 (!) 25 (!) 24  Temp:   98.4 F (36.9 C)   TempSrc:   Oral   SpO2: 100% 97%    Weight:      Height:        Intake/Output Summary (Last 24 hours) at 04/26/2019 1142 Last data filed at 04/26/2019 0900 Gross per 24 hour  Intake 240 ml  Output -  Net 240 ml   Filed Weights   04/23/19 0304  Weight: 88.5 kg    Examination:   General: resting in bed Mild tremor when awoken No increased work of breathing     Data Reviewed: I have personally reviewed following labs and imaging studies  CBC: Recent Labs  Lab 04/23/19 0730 04/24/19 0848 04/26/19 1014  WBC 9.9 11.4* 11.4*  NEUTROABS 8.0*  --  7.6  HGB 13.1 12.7* 14.1  HCT 41.2 39.0 41.1  MCV 83.6 81.1 79.3*  PLT 82* 123* 355   Basic Metabolic Panel: Recent  Labs  Lab 04/23/19 0730 04/24/19 0848 04/26/19 1014  NA 138  --  138  K 3.3*  --  3.2*  CL 102  --  103  CO2 24  --  22  GLUCOSE 137*  --  147*  BUN 13  --  10  CREATININE 0.74 0.66 0.70  CALCIUM 8.8*  --  8.3*   GFR: Estimated Creatinine Clearance: 134.6 mL/min (by C-G formula based on SCr of 0.7 mg/dL). Liver Function Tests: Recent Labs  Lab 04/23/19 0730  AST 45*  ALT 33  ALKPHOS 113  BILITOT 0.6  PROT 7.3  ALBUMIN 3.3*   No results for input(s): LIPASE, AMYLASE in the last 168 hours. No results for input(s): AMMONIA in the  last 168 hours. Coagulation Profile: Recent Labs  Lab 04/23/19 0730  INR 1.1   Cardiac Enzymes: No results for input(s): CKTOTAL, CKMB, CKMBINDEX, TROPONINI in the last 168 hours. BNP (last 3 results) No results for input(s): PROBNP in the last 8760 hours. HbA1C: No results for input(s): HGBA1C in the last 72 hours. CBG: No results for input(s): GLUCAP in the last 168 hours. Lipid Profile: No results for input(s): CHOL, HDL, LDLCALC, TRIG, CHOLHDL, LDLDIRECT in the last 72 hours. Thyroid Function Tests: No results for input(s): TSH, T4TOTAL, FREET4, T3FREE, THYROIDAB in the last 72 hours. Anemia Panel: No results for input(s): VITAMINB12, FOLATE, FERRITIN, TIBC, IRON, RETICCTPCT in the last 72 hours.    Radiology Studies: I have reviewed all of the imaging during this hospital visit personally     Scheduled Meds: . enoxaparin (LOVENOX) injection  40 mg Subcutaneous Q24H  . fentaNYL   Intravenous Q4H  . ondansetron  8 mg Oral Once  . potassium chloride  40 mEq Oral Once   Continuous Infusions: . sodium chloride 10 mL/hr at 04/24/19 0134  .  ceFAZolin (ANCEF) IV 2 g (04/26/19 0612)     LOS: 3 days    Joseph Art, DO

## 2019-04-26 NOTE — Progress Notes (Addendum)
Pt is alert, oriented, agitated now.  He can't reach his girlfriend and he's mad. He has taken his monitor off.  He yelled at me when I took his IV out because I told him there was no way he was leaving with it in.  He refused to sign the AMA papers.  Dr. Eliseo Squires was notified, pt said he would wait in room until oral abx rx comes.  Pt is agitated and pacing in the room.   PCA Fentanyl 38ml wasted with Jocelyn Lamer RN  Pt was instructed to pick up antibiotics at [facility] on [address] in [place], he said that was the pharmacy he uses.   Jocelyn Lamer and [place] are witnesses.

## 2019-04-26 NOTE — Progress Notes (Signed)
[facility] for Infectious Disease  Date of Admission:  04/23/2019      Total days of antibiotics 4  Day 3 cefazolin           ASSESSMENT: Christopher Shields is a 43 y.o. male with MSSA (tetracycline-R) bacteremia secondary to C-spine infection/phlegmon. He was evaluated by neurosurgery and no need for decompression at this time. His TTE was negative for any significant valvular abnormalities and revealed no large vegetations - EF mildly reduced 45-50%. It seems that he is clearing his bacteremia based on 10/2 draw being no growth from that single blood culture.   I explained to him today the typical course for treatment of this kind of infection being 6-8 weeks IV antibiotics and this would for his case need to be in a supervised setting given his severe opioid dependence with current injection. He will not stay the whole 6 weeks due to concern over losing house/work, etc. I asked him to please consider 2 weeks of IV antibiotics here; if he was doing better could consider 6-8 more weeks of oral regimen. Of course if he leaves AMA given the level of this infection I would prefer to do some harm reduction and get him some PO antibiotics and early follow up with ID clinic. Baseline CRP 22 ESR 51  ?if he would do better on methadone for now (I see a note he was taking quite a substantial amount prior to hospital stay). Will defer to Dr. Eliseo Squires.     PLAN: 1. Continue cefazolin IV 2. Follow repeated BCx  3. Would not place PICC yet (flight risk) 4. Check HIV and Hep C / B    Principal Problem:   Bacteremia due to methicillin susceptible Staphylococcus aureus (MSSA) Active Problems:   Septic arthritis of cervical spine (HCC)   Lobar pneumonia, unspecified organism (HCC)   Active intravenous drug use   Normocytic anemia   Thrombocytopenia (HCC)   Cigarette smoker   S/P splenectomy    enoxaparin (LOVENOX) injection  40 mg Subcutaneous Q24H   fentaNYL   Intravenous Q4H    ondansetron  8 mg Oral Once    SUBJECTIVE: He says that he is starting to go through withdrawals (sneezing, runny nose, nausea, sweating). He is fully able to raise/move legs and arms without compromise. No changes to bowel/bladder habits. Rigid collar in place for stability.   Fentanyl PCA pump is not treating his pain to his expectation and he "is not sure he can hang on longer here."  He has been injecting for 13 years now off an on but recently prior to hospitalization was back to heavy daily use. Has been able to quit for periods of time in the past (90 - 120 days)   Lab Results  Component Value Date   CRP 22.0 (H) 04/23/2019   Cspine MRI (limited by motion) IMPRESSION: 1. Approximately 1 cc fluid signal collection in the right longissimus capitis muscle probably representing abscess. There is an effusion of the right C3-4 facet joint which could be reactive or could be septic; the effusion is notable but there is little in the way of marrow edema in the facets. 2. Infiltrative inflammatory phlegmon extending in regional musculature of the right neck around the longissimus capitis, as detailed in the report. Some of this inflammation extends posterior to the spine between the spinalis cervicis and semi spinalis capitis muscles to cross to the contralateral side. 3. Despite efforts by the technologist  and patient, motion artifact is present on today's exam and could not be eliminated. This reduces exam sensitivity and specificity.  Review of Systems: Review of Systems  Constitutional: Positive for chills and diaphoresis. Negative for fever.  HENT: Negative for sore throat and tinnitus.        Sneezing  Eyes: Negative for blurred vision and photophobia.  Respiratory: Negative for cough and sputum production.   Cardiovascular: Negative for chest pain and orthopnea.  Gastrointestinal: Negative for diarrhea, nausea and vomiting.  Genitourinary: Negative for dysuria.    Musculoskeletal: Positive for neck pain.  Skin: Negative for rash.  Neurological: Negative for headaches.  Psychiatric/Behavioral: Positive for substance abuse.    No Known Allergies  OBJECTIVE: Vitals:   04/26/19 0715 04/26/19 0743 04/26/19 0801 04/26/19 1000  BP:   130/88 119/70  Pulse:      Resp: (!) 26 (!) 24 (!) 25 (!) 24  Temp:   98.4 F (36.9 C)   TempSrc:   Oral   SpO2: 100% 97%    Weight:      Height:       Body mass index is 25.74 kg/m.  Physical Exam Vitals signs and nursing note reviewed.  Constitutional:      Appearance: Normal appearance.  Neurological:     Mental Status: He is alert.     Lab Results Lab Results  Component Value Date   WBC 11.4 (H) 04/24/2019   HGB 12.7 (L) 04/24/2019   HCT 39.0 04/24/2019   MCV 81.1 04/24/2019   PLT 123 (L) 04/24/2019    Lab Results  Component Value Date   CREATININE 0.66 04/24/2019   BUN 13 04/23/2019   NA 138 04/23/2019   K 3.3 (L) 04/23/2019   CL 102 04/23/2019   CO2 24 04/23/2019    Lab Results  Component Value Date   ALT 33 04/23/2019   AST 45 (H) 04/23/2019   ALKPHOS 113 04/23/2019   BILITOT 0.6 04/23/2019     Microbiology: Recent Results (from the past 240 hour(s))  Culture, blood (Routine x 2)     Status: Abnormal   Collection Time: 04/23/19  3:34 AM   Specimen: BLOOD RIGHT ARM  Result Value Ref Range Status   Specimen Description   Final    BLOOD RIGHT ARM Performed at [facility], [address] [place], [place] [address]    Special Requests   Final    BOTTLES DRAWN AEROBIC AND ANAEROBIC Blood Culture adequate volume Performed at [facility], [address] [place], [place] [address]    Culture  Setup Time   Final    IN BOTH AEROBIC AND ANAEROBIC BOTTLES GRAM POSITIVE COCCI CRITICAL VALUE NOTED.  VALUE IS CONSISTENT WITH PREVIOUSLY REPORTED AND CALLED VALUE.    Culture (A)  Final    STAPHYLOCOCCUS AUREUS SUSCEPTIBILITIES PERFORMED ON PREVIOUS CULTURE  WITHIN THE LAST 5 DAYS. Performed at [facility], [place] [address] [place], [place] [address]    Report Status 04/25/2019 FINAL  Final  Culture, blood (Routine x 2)     Status: Abnormal   Collection Time: 04/23/19  4:45 AM   Specimen: BLOOD  Result Value Ref Range Status   Specimen Description   Final    BLOOD RIGHT KNEE Performed at [facility], [address] [place], [place] [address]    Special Requests   Final    BOTTLES DRAWN AEROBIC AND ANAEROBIC Blood Culture adequate volume Performed  at [facility], [address] [place], [place] [address]    Culture  Setup Time   Final    IN BOTH AEROBIC AND ANAEROBIC BOTTLES GRAM POSITIVE COCCI CRITICAL RESULT CALLED TO, READ BACK BY AND VERIFIED WITH: Jennye Boroughs RN 04/23/19 2255 JDW Performed at [facility], [address], [place], [place] [address]    Culture STAPHYLOCOCCUS AUREUS (A)  Final   Report Status 04/25/2019 FINAL  Final   Organism ID, Bacteria STAPHYLOCOCCUS AUREUS  Final      Susceptibility   Staphylococcus aureus - MIC*    CIPROFLOXACIN <=0.5 SENSITIVE Sensitive     ERYTHROMYCIN <=0.25 SENSITIVE Sensitive     GENTAMICIN <=0.5 SENSITIVE Sensitive     OXACILLIN 0.5 SENSITIVE Sensitive     TETRACYCLINE >=16 RESISTANT Resistant     VANCOMYCIN 1 SENSITIVE Sensitive     TRIMETH/SULFA <=10 SENSITIVE Sensitive     CLINDAMYCIN <=0.25 SENSITIVE Sensitive     RIFAMPIN <=0.5 SENSITIVE Sensitive     Inducible Clindamycin NEGATIVE Sensitive     * STAPHYLOCOCCUS AUREUS  Blood Culture ID Panel (Reflexed)     Status: Abnormal   Collection Time: 04/23/19  4:45 AM  Result Value Ref Range Status   Enterococcus species NOT DETECTED NOT DETECTED Final   Listeria monocytogenes NOT DETECTED NOT DETECTED Final   Staphylococcus species DETECTED (A) NOT DETECTED Final    Comment: CRITICAL RESULT CALLED TO, READ BACK BY AND VERIFIED WITH: Jennye Boroughs RN 04/23/19 2255 JDW    Staphylococcus aureus (BCID)  DETECTED (A) NOT DETECTED Final    Comment: Methicillin (oxacillin) susceptible Staphylococcus aureus (MSSA). Preferred therapy is anti staphylococcal beta lactam antibiotic (Cefazolin or Nafcillin), unless clinically contraindicated. CRITICAL RESULT CALLED TO, READ BACK BY AND VERIFIED WITH: Jennye Boroughs RN 04/23/19 2255 JDW    Methicillin resistance NOT DETECTED NOT DETECTED Final   Streptococcus species NOT DETECTED NOT DETECTED Final   Streptococcus agalactiae NOT DETECTED NOT DETECTED Final   Streptococcus pneumoniae NOT DETECTED NOT DETECTED Final   Streptococcus pyogenes NOT DETECTED NOT DETECTED Final   Acinetobacter baumannii NOT DETECTED NOT DETECTED Final   Enterobacteriaceae species NOT DETECTED NOT DETECTED Final   Enterobacter cloacae complex NOT DETECTED NOT DETECTED Final   Escherichia coli NOT DETECTED NOT DETECTED Final   Klebsiella oxytoca NOT DETECTED NOT DETECTED Final   Klebsiella pneumoniae NOT DETECTED NOT DETECTED Final   Proteus species NOT DETECTED NOT DETECTED Final   Serratia marcescens NOT DETECTED NOT DETECTED Final   Haemophilus influenzae NOT DETECTED NOT DETECTED Final   Neisseria meningitidis NOT DETECTED NOT DETECTED Final   Pseudomonas aeruginosa NOT DETECTED NOT DETECTED Final   Candida albicans NOT DETECTED NOT DETECTED Final   Candida glabrata NOT DETECTED NOT DETECTED Final   Candida krusei NOT DETECTED NOT DETECTED Final   Candida parapsilosis NOT DETECTED NOT DETECTED Final   Candida tropicalis NOT DETECTED NOT DETECTED Final    Comment: Performed at [facility], [address], [place], [place] [address]  SARS CORONAVIRUS 2 (TAT 6-24 HRS) Nasopharyngeal Nasopharyngeal Swab     Status: None   Collection Time: 04/23/19  9:52 AM   Specimen: Nasopharyngeal Swab  Result Value Ref Range Status   SARS Coronavirus 2 NEGATIVE NEGATIVE Final    Comment: (NOTE) SARS-CoV-2 target nucleic acids are NOT DETECTED. The SARS-CoV-2 RNA is generally  detectable in upper and lower respiratory specimens during the acute phase of infection. Negative results do not preclude  SARS-CoV-2 infection, do not rule out co-infections with other pathogens, and should not be used as the sole basis for treatment or other patient management decisions. Negative results must be combined with clinical observations, patient history, and epidemiological information. The expected result is Negative. Fact Sheet for Patients: SugarRoll.be Fact Sheet for Healthcare Providers: https://www.woods-mathews.com/ This test is not yet approved or cleared by the [place] FDA and  has been authorized for detection and/or diagnosis of SARS-CoV-2 by FDA under an Emergency Use Authorization (EUA). This EUA will remain  in effect (meaning this test can be used) for the duration of the COVID-19 declaration under Section 56 4(b)(1) of the Act, 21 U.S.C. section 360bbb-3(b)(1), unless the authorization is terminated or revoked sooner. Performed at [facility], [place] [address], [place], [place] [address]   Culture, blood (single)     Status: None (Preliminary result)   Collection Time: 04/24/19  5:41 AM   Specimen: BLOOD  Result Value Ref Range Status   Specimen Description BLOOD RIGHT ANTECUBITAL  Final   Special Requests   Final    BOTTLES DRAWN AEROBIC AND ANAEROBIC Blood Culture results may not be optimal due to an inadequate volume of blood received in culture bottles   Culture   Final    NO GROWTH 2 DAYS Performed at [facility], [place] [address], [place], [place] [address]    Report Status PENDING  Incomplete    Janene Madeira, MSN, NP-C [place] for [email or web address] Disease [email or web address] Pager: [phone] Office: [place]: [phone]   10:53 AM   04/26/2019

## 2019-04-26 NOTE — Progress Notes (Signed)
Patient said he was on 195mg  of Methadone 2 weeks prior to coming in here.

## 2019-04-26 NOTE — Telephone Encounter (Signed)
Patient left AMA prior to our transitions of care pharmacy ability to get him his linezolid. I called his pharmacy on file to give them his appointment information and contact to the ID clinic to see if they will give him the message.    Janene Madeira, MSN, NP-C [facility] for Infectious Disease [email or web address] Pager: [phone] Office: [phone] [place]: [phone]

## 2019-04-26 NOTE — Progress Notes (Signed)
Pt is very sleepy now, difficult to arouse and cannot stay awake.  Girlfriend, Colletta Maryland is at bedside.

## 2019-04-26 NOTE — Progress Notes (Signed)
Doctor was just in room and is aware of pt condition.

## 2019-04-26 NOTE — Progress Notes (Signed)
PCA syringe changed. 1.29ml wasted in stericycle with Rodman Pickle, RN.

## 2019-04-29 LAB — CULTURE, BLOOD (SINGLE): Culture: NO GROWTH

## 2019-05-11 ENCOUNTER — Inpatient Hospital Stay: Payer: Self-pay | Admitting: Infectious Diseases

## 2019-05-12 NOTE — Discharge Summary (Signed)
Patient left AMA  On 10/5- please see progress note from that day. Eulogio Bear DO

## 2019-10-04 ENCOUNTER — Other Ambulatory Visit: Payer: Self-pay

## 2019-10-04 ENCOUNTER — Encounter (HOSPITAL_COMMUNITY): Payer: Self-pay

## 2019-10-04 ENCOUNTER — Emergency Department (HOSPITAL_COMMUNITY)
Admission: EM | Admit: 2019-10-04 | Discharge: 2019-10-04 | Disposition: A | Discharge status: Home / Self Care | Payer: Self-pay | Attending: Emergency Medicine | Admitting: Emergency Medicine

## 2019-10-04 DIAGNOSIS — T40604A Poisoning by unspecified narcotics, undetermined, initial encounter: Secondary | ICD-10-CM | POA: Insufficient documentation

## 2019-10-04 DIAGNOSIS — F17210 Nicotine dependence, cigarettes, uncomplicated: Secondary | ICD-10-CM | POA: Insufficient documentation

## 2019-10-04 DIAGNOSIS — Z79899 Other long term (current) drug therapy: Secondary | ICD-10-CM | POA: Insufficient documentation

## 2019-10-04 NOTE — ED Provider Notes (Signed)
[facility]-EMERGENCY DEPT Provider Note   CSN: [id number] Arrival date & time: 10/04/19  1924     History Chief Complaint  Patient presents with  . Drug Overdose    Christopher Shields is a 44 y.o. male.  44 year old male who presents after intentionally ingesting marijuana that was likely laced with opiates.  States that he was not try to harm himself.  Was found by his friends who called EMS.  They gave patient Narcan and patient is now back normal.  He denies being short of breath.  No other complaints.        Past Medical History:  Diagnosis Date  . Drug use     Patient Active Problem List   Diagnosis Date Noted  . IV drug abuse (HCC)   . Septic arthritis of vertebra (HCC)   . Lobar pneumonia, unspecified organism (HCC) 04/24/2019  . Bacteremia due to methicillin susceptible Staphylococcus aureus (MSSA) 04/24/2019  . Active intravenous drug use 04/24/2019  . Normocytic anemia 04/24/2019  . Thrombocytopenia (HCC) 04/24/2019  . Cigarette smoker 04/24/2019  . S/P splenectomy 04/24/2019  . Septic arthritis of cervical spine (HCC) 04/23/2019    Past Surgical History:  Procedure Laterality Date  . SPLENECTOMY, TOTAL         No family history on file.  Social History   Tobacco Use  . Smoking status: Current Every Day Smoker    Types: Cigarettes  . Smokeless tobacco: Never Used  Substance Use Topics  . Alcohol use: Yes  . Drug use: Yes    Types: IV    Comment: opiates     Home Medications Prior to Admission medications   Medication Sig Start Date End Date Taking? Authorizing Provider  methadone (DOLOPHINE) 10 MG tablet Take 195 mg by mouth daily.    [provider]    Allergies    Patient has no known allergies.  Review of Systems   Review of Systems  All other systems reviewed and are negative.   Physical Exam Updated Vital Signs BP (!) 141/85 (BP Location: Right Arm)   Pulse (!) 115   Temp 98.8 F (37.1 C) (Oral)    Resp 18   Ht 1.88 m (6\' 2" )   Wt 81.6 kg   SpO2 97%   BMI 23.11 kg/m   Physical Exam Vitals and nursing note reviewed.  Constitutional:      General: He is not in acute distress.    Appearance: Normal appearance. He is well-developed. He is not toxic-appearing.  HENT:     Head: Normocephalic and atraumatic.  Eyes:     General: Lids are normal.     Conjunctiva/sclera: Conjunctivae normal.     Pupils: Pupils are equal, round, and reactive to light.  Neck:     Thyroid: No thyroid mass.     Trachea: No tracheal deviation.  Cardiovascular:     Rate and Rhythm: Normal rate and regular rhythm.     Heart sounds: Normal heart sounds. No murmur. No gallop.   Pulmonary:     Effort: Pulmonary effort is normal. No respiratory distress.     Breath sounds: Normal breath sounds. No stridor. No decreased breath sounds, wheezing, rhonchi or rales.  Abdominal:     General: Bowel sounds are normal. There is no distension.     Palpations: Abdomen is soft.     Tenderness: There is no abdominal tenderness. There is no rebound.  Musculoskeletal:        General: No  tenderness. Normal range of motion.     Cervical back: Normal range of motion and neck supple.  Skin:    General: Skin is warm and dry.     Findings: No abrasion or rash.  Neurological:     Mental Status: He is alert and oriented to person, place, and time.     GCS: GCS eye subscore is 4. GCS verbal subscore is 5. GCS motor subscore is 6.     Cranial Nerves: No cranial nerve deficit.     Sensory: No sensory deficit.  Psychiatric:        Speech: Speech normal.        Behavior: Behavior normal.     ED Results / Procedures / Treatments   Labs (all labs ordered are listed, but only abnormal results are displayed) Labs Reviewed - No data to display  EKG None  Radiology No results found.  Procedures Procedures (including critical care time)  Medications Ordered in ED Medications - No data to display  ED Course  I have  reviewed the triage vital signs and the nursing notes.  Pertinent labs & imaging results that were available during my care of the patient were reviewed by me and considered in my medical decision making (see chart for details).    MDM Rules/Calculators/A&P                     Patient requesting to leave at this time.  Denies wanting to stay for further monitoring.  He has no acute psychiatric condition.  Return precautions given Final Clinical Impression(s) / ED Diagnoses Final diagnoses:  None    Rx / DC Orders ED Discharge Orders    None       Lacretia Leigh, MD 10/04/19 2012

## 2019-10-04 NOTE — ED Triage Notes (Addendum)
Arrived Ems coming from a shopping store. Friends called [facility] due to being unresponsive. Pinpoint pupils and approx 4rr/ min. Briefly assisted with ventilations until 2mg  narcan given and pt became responsive. Placed on NRB. Pt denies opiate use sts only hit a weed pen.   cbg-230  Hr - 120 bp - 134/77 spo2 -96

## 2020-07-31 IMAGING — CT CT NECK W/O CM
3 of 4 series · 13 of 33 positions shown, 16 images · non-contrast
Comparison: None.

CLINICAL DATA: Right-sided neck pain and fever. Bilateral eye
redness. Suspected sepsis. IV drug abuse with no IV access

EXAM:
CT NECK WITHOUT CONTRAST
TECHNIQUE: Multidetector CT imaging of the neck was performed following the
standard protocol without intravenous contrast.

[Series 5: sag neck · sagittal · 0.59mm/px · 5 of 91 slices shown, 6 images]
[im 31/91  bone]
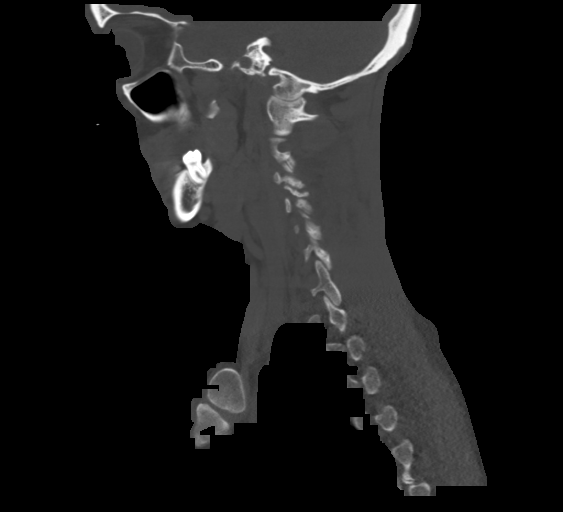
[im 38/91  bone]
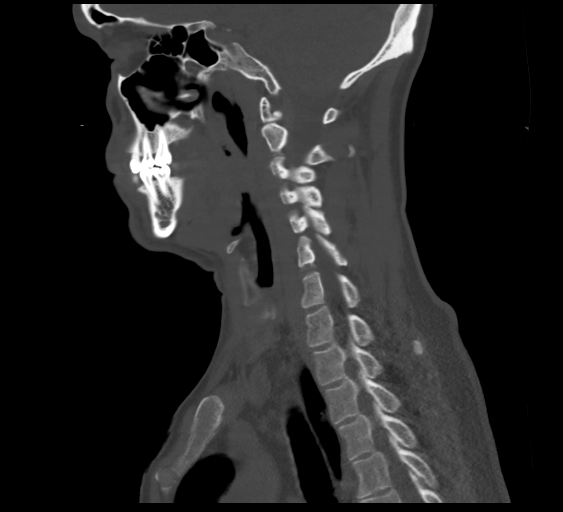
[im 46/91  soft-tissue]
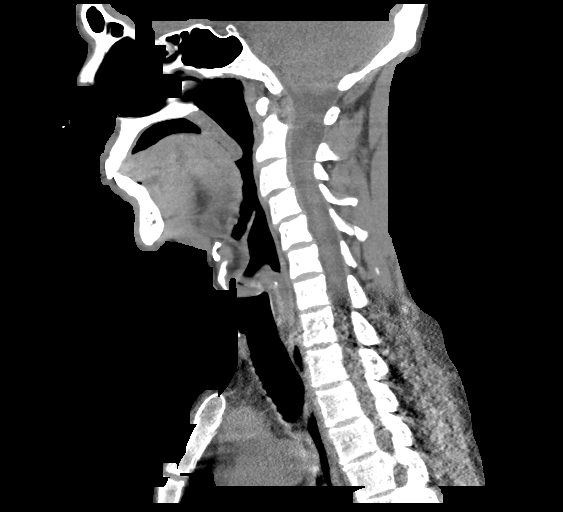
[im 46/91  bone]
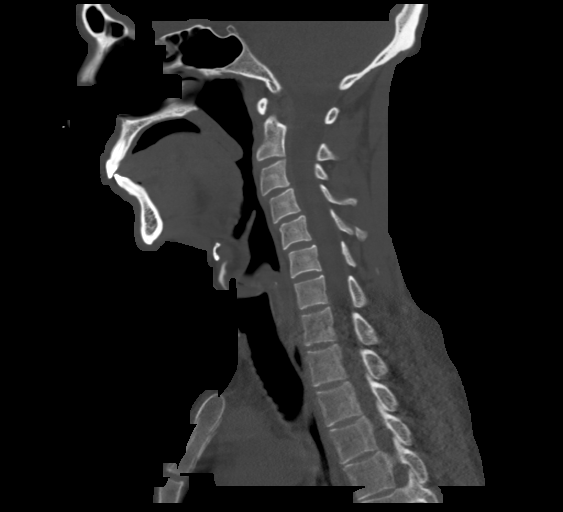
[im 53/91  bone]
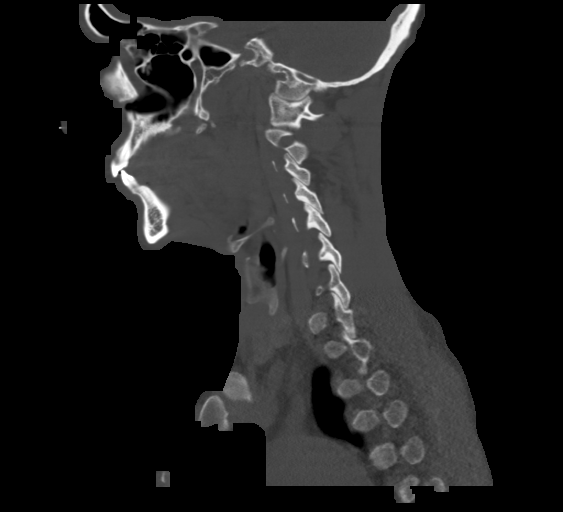
[im 61/91  bone]
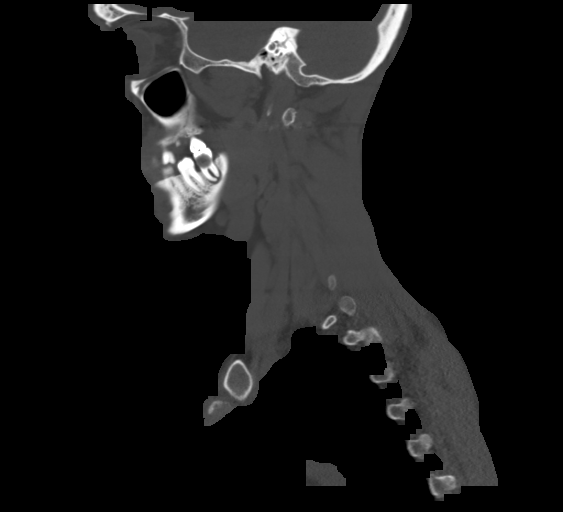

[Series 6: cor neck · coronal · 0.70mm/px · 3 of 105 slices shown]
[im 28/105  bone]
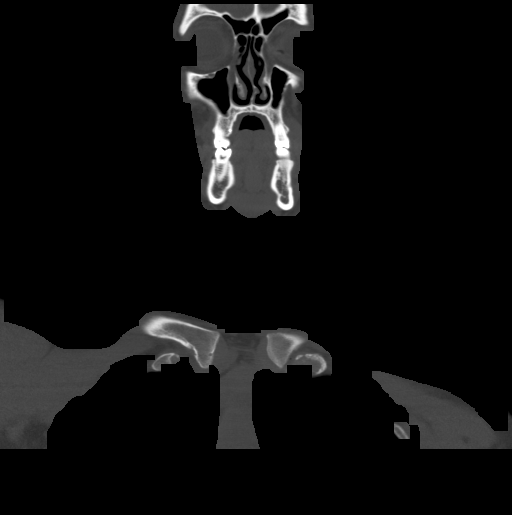
[im 44/105  bone]
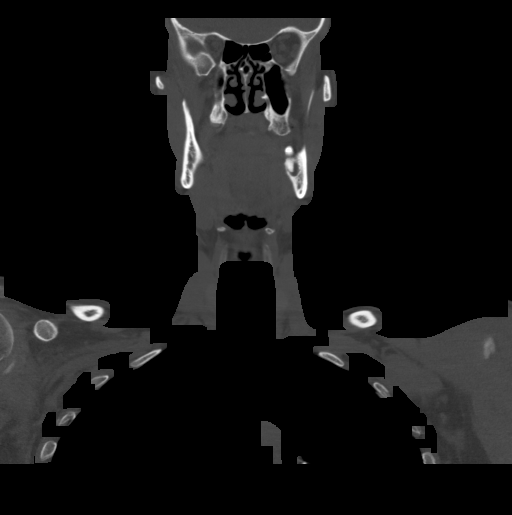
[im 61/105  bone]
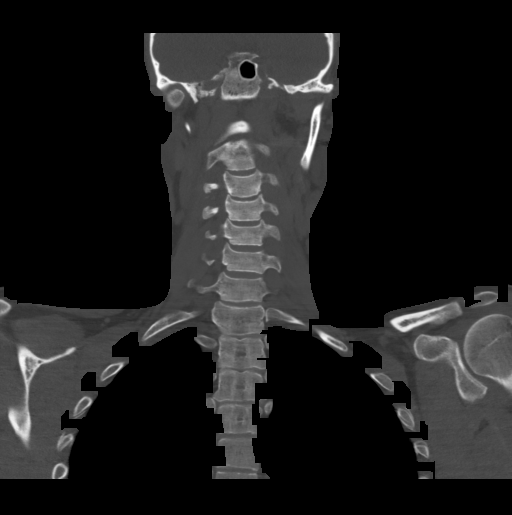

[Series 7: orthogonal ax · axial · 0.44mm/px · z∈[-768,-527]mm · 5 of 177 slices shown, 7 images]
[im 26/177  soft-tissue]
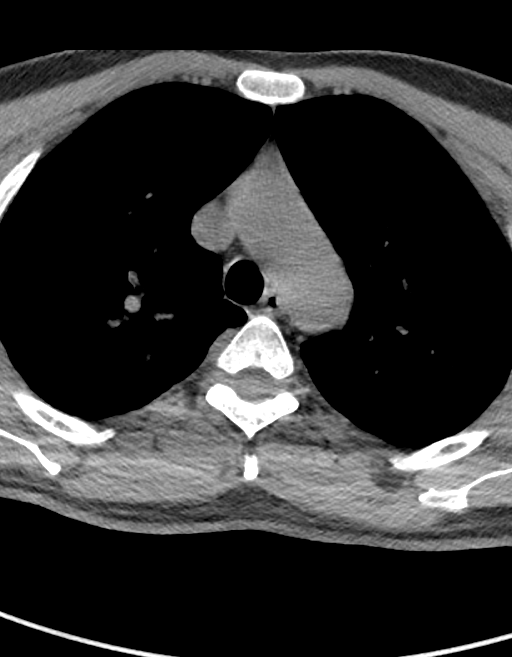
[im 26/177  bone]
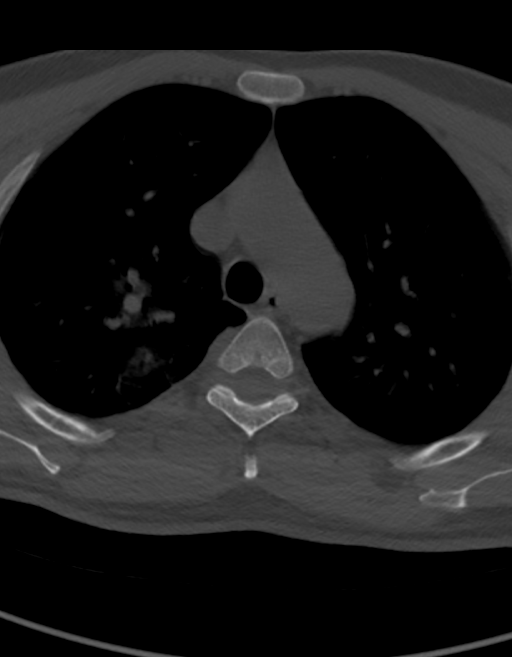
[im 51/177  bone]
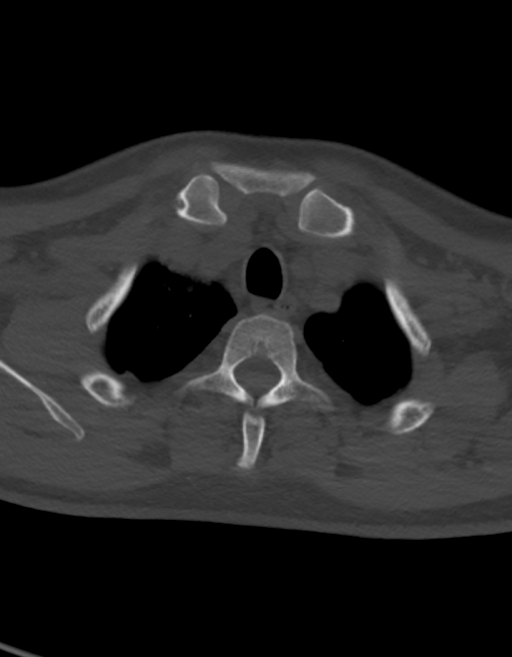
[im 101/177  bone]
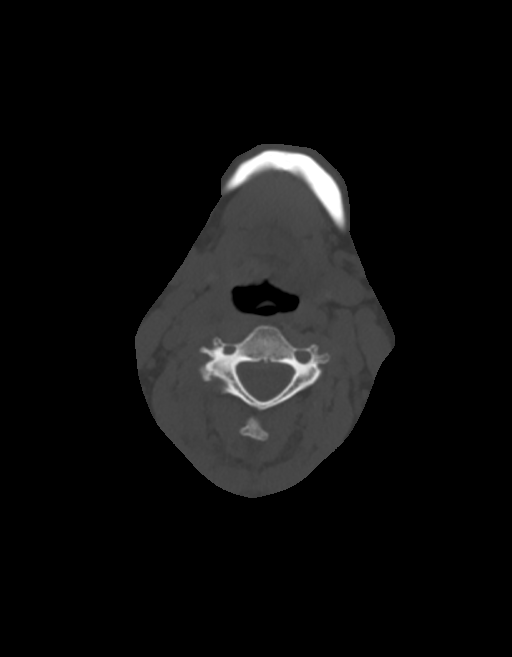
[im 126/177  bone]
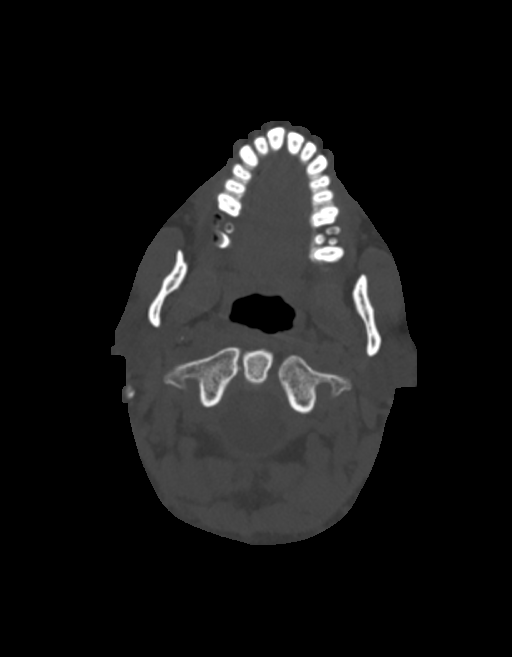
[im 151/177  soft-tissue]
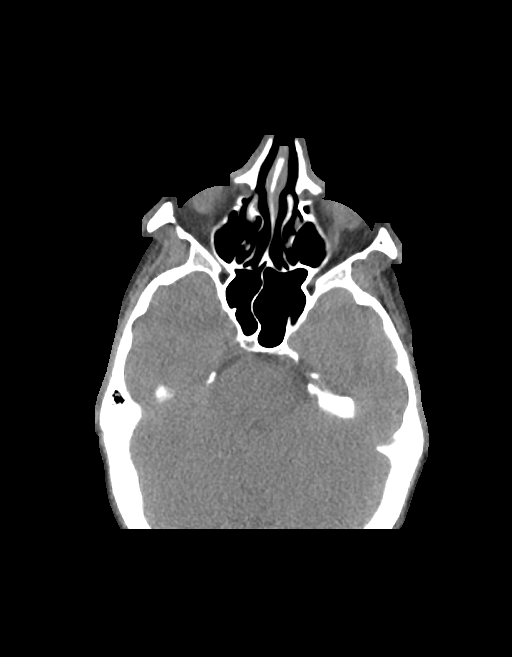
[im 151/177  bone]
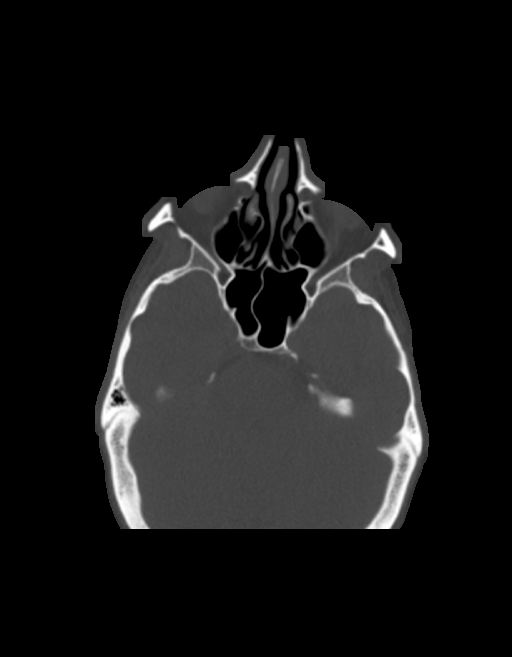

[13 of 33 positions shown; findings below may reference images not displayed]

FINDINGS: Pharynx and larynx: Normal. No mass or swelling.

Salivary glands: No inflammation, mass, or stone.

Thyroid: Normal.

Lymph nodes: None enlarged or abnormal density.

Vascular: Negative

Limited intracranial: Negative.

Visualized orbits: Normal

Mastoids and visualized paranasal sinuses: Clear

Skeleton: Right C3-4 facet arthropathy with subchondral erosion and
anterolisthesis. Subtle but convincing regional soft tissue edema,
which is remarkable for a noncontrast CT. No gross collection.

Upper chest: Consolidation in the right upper lobe without
cavitation.
IMPRESSION: 1. C3-4 erosive right facet arthropathy with anterolisthesis.
Findings primarily worrisome for septic arthritis given history of
IV drug abuse and degree of regional soft tissue edema. MRI with
contrast would be the best imaging study for full characterization.
2. Right upper lobe pneumonia without cavitation.

## 2020-07-31 IMAGING — DX DG CHEST 2V
3 series · 3 of 3 positions shown · non-contrast
Comparison: None.

CLINICAL DATA: Sepsis

EXAM:
CHEST - 2 VIEW

[chest pa]
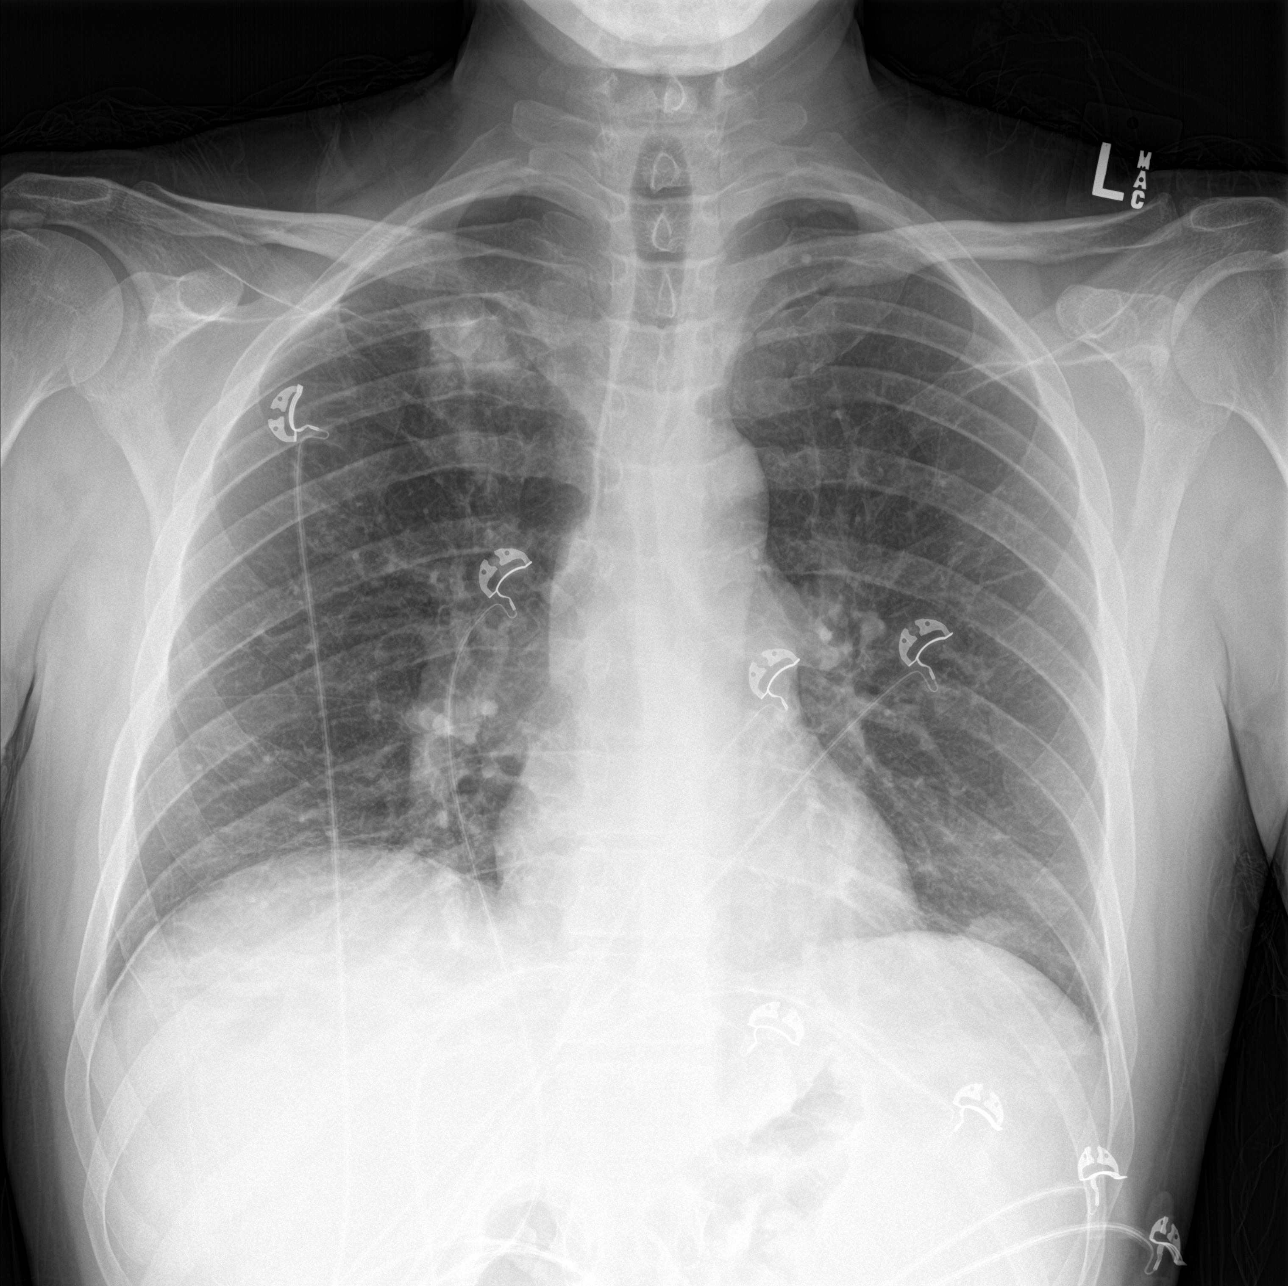

[chest lat (1 of 2)]
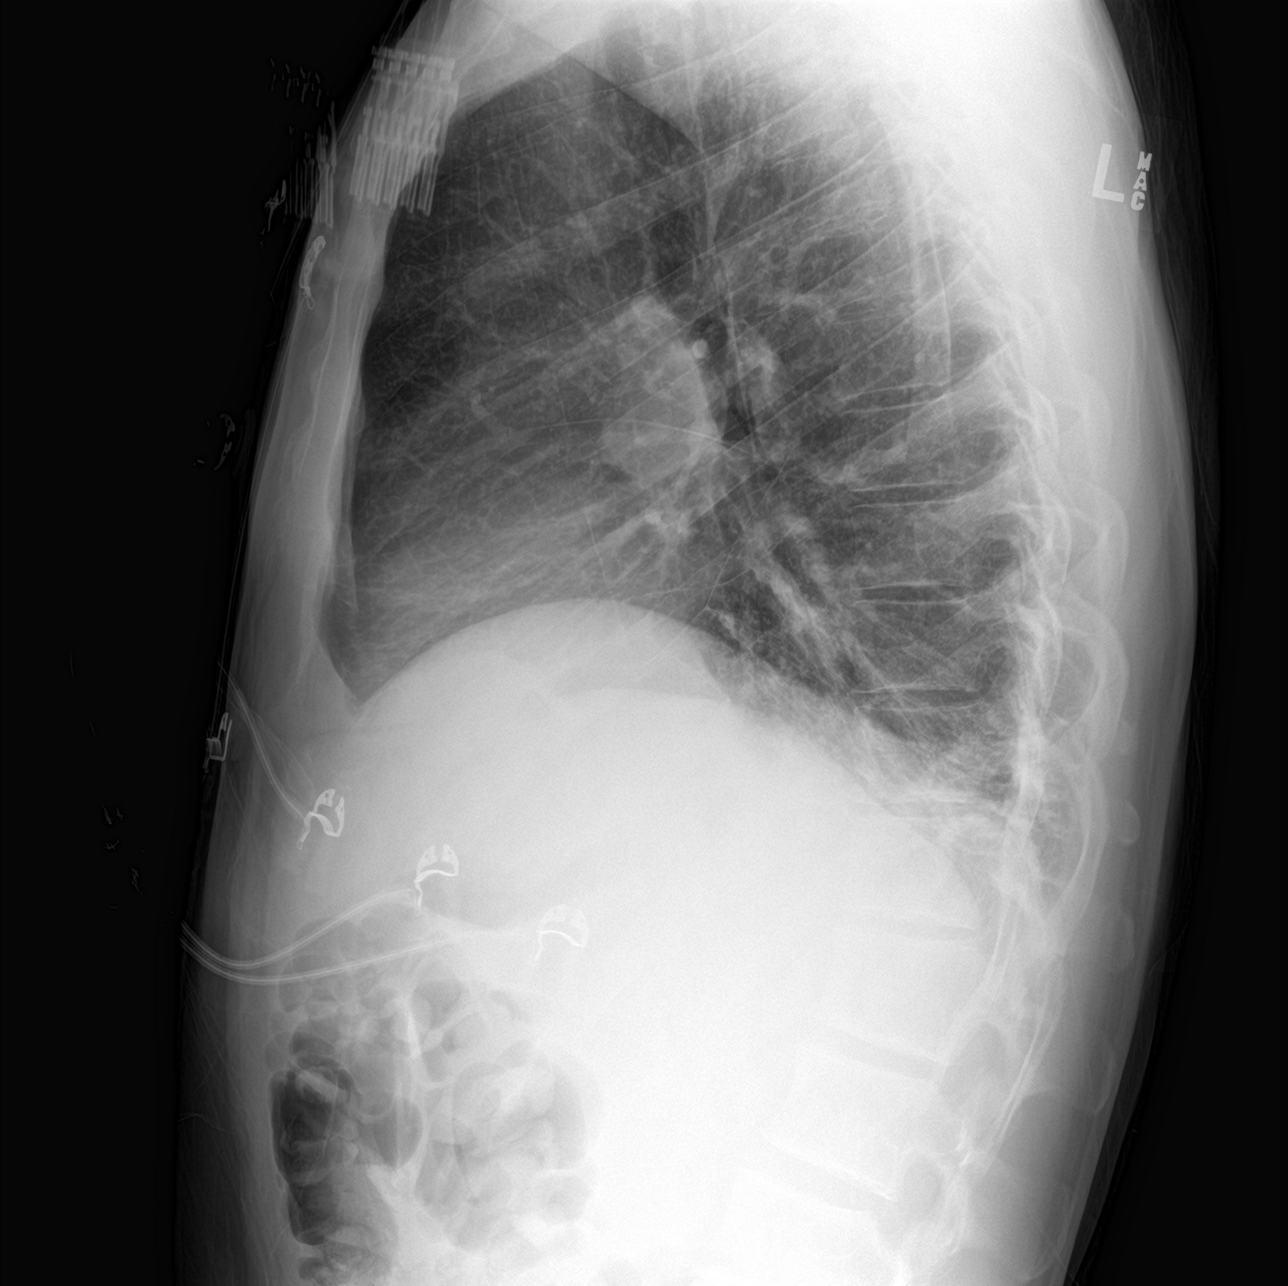

[chest lat (2 of 2)]
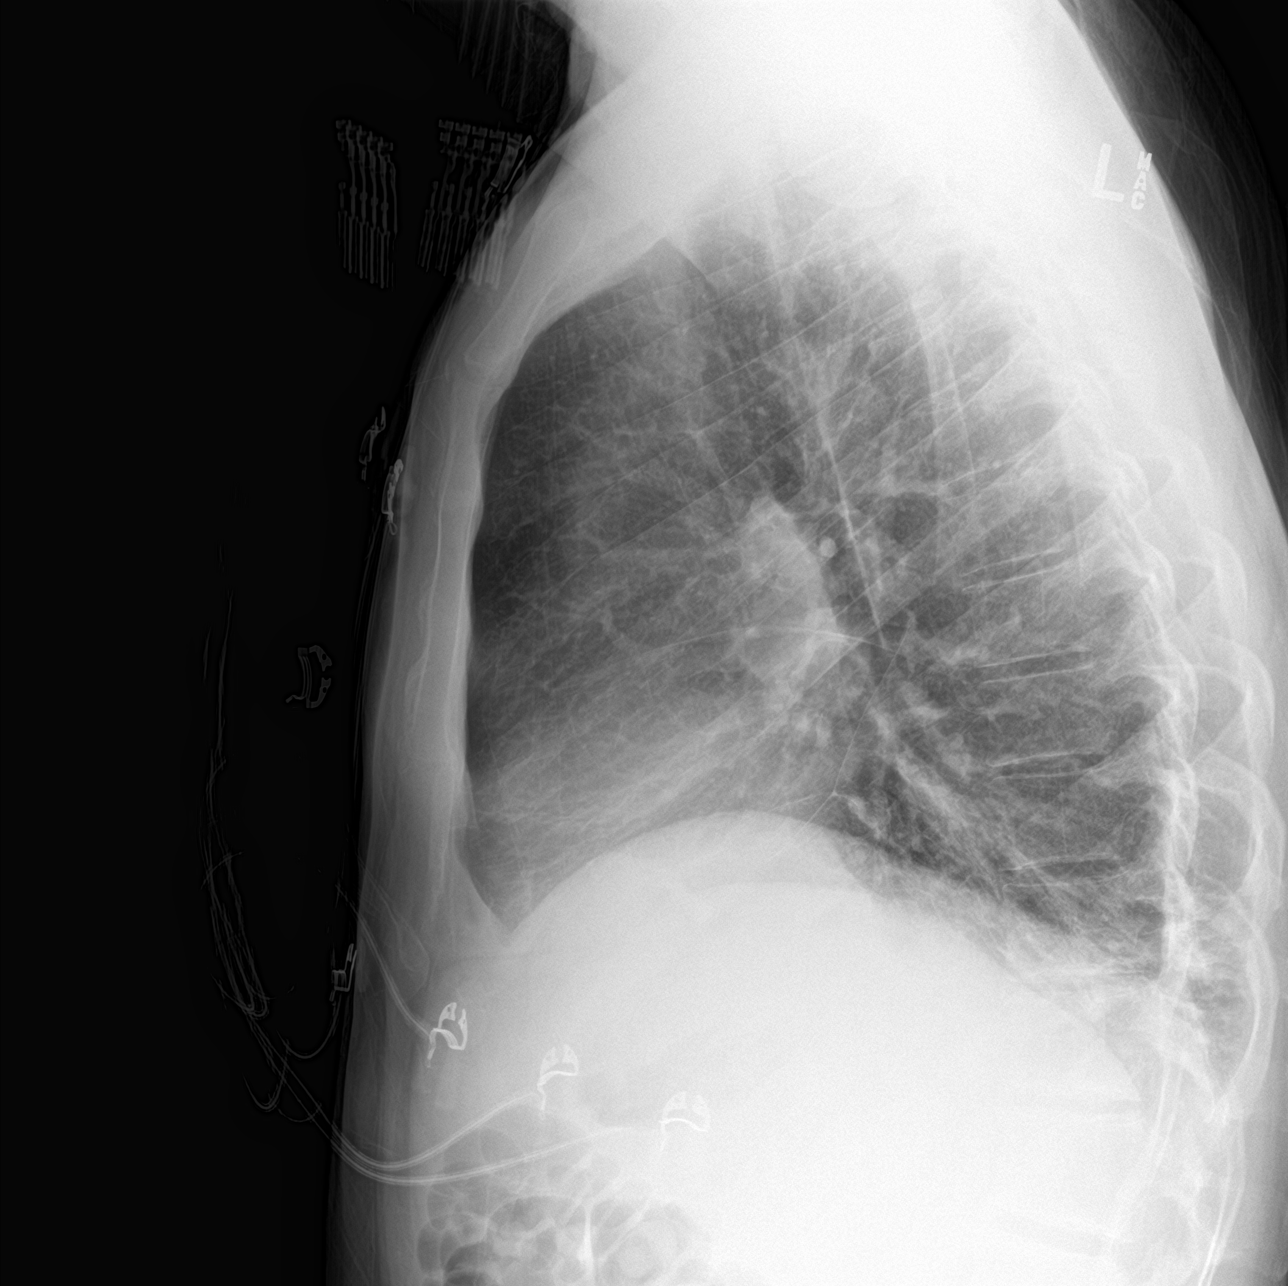

[3 of 3 positions shown; findings below may reference images not displayed]

FINDINGS: Consolidation versus mass at the right apex. Indistinct opacity at
the lung bases. No edema, effusion, or pneumothorax. Normal heart
size and mediastinal contours.
IMPRESSION: Pneumonia versus nodule at the right apex. Streaky opacity at the
bases from atelectasis or bronchopneumonia. Followup PA and lateral
chest X-ray is recommended in 3-4 weeks following trial of
antibiotic therapy to ensure resolution and exclude mass.
